# Patient Record
Sex: Female | Born: 1960
Health system: Southern US, Community
[De-identification: ages and names within clinical notes are randomized; demographics above are authoritative.]

## PROBLEM LIST (undated history)

## (undated) DIAGNOSIS — F419 Anxiety disorder, unspecified: Secondary | ICD-10-CM

## (undated) DIAGNOSIS — E785 Hyperlipidemia, unspecified: Secondary | ICD-10-CM

## (undated) DIAGNOSIS — M8580 Other specified disorders of bone density and structure, unspecified site: Secondary | ICD-10-CM

## (undated) DIAGNOSIS — A64 Unspecified sexually transmitted disease: Secondary | ICD-10-CM

## (undated) DIAGNOSIS — R32 Unspecified urinary incontinence: Secondary | ICD-10-CM

## (undated) HISTORY — DX: Hyperlipidemia, unspecified: E78.5

## (undated) HISTORY — DX: Anxiety disorder, unspecified: F41.9

## (undated) HISTORY — PX: BREAST SURGERY: SHX581

## (undated) HISTORY — DX: Unspecified urinary incontinence: R32

## (undated) HISTORY — PX: BREAST EXCISIONAL BIOPSY: SUR124

## (undated) HISTORY — DX: Unspecified sexually transmitted disease: A64

## (undated) HISTORY — DX: Other specified disorders of bone density and structure, unspecified site: M85.80

## (undated) HISTORY — PX: COLONOSCOPY: SHX174

## (undated) HISTORY — PX: OTHER SURGICAL HISTORY: SHX169

---

## 2000-08-21 ENCOUNTER — Encounter: Payer: Self-pay | Admitting: Family Medicine

## 2000-08-21 ENCOUNTER — Encounter: Admission: RE | Admit: 2000-08-21 | Discharge: 2000-08-21 | Payer: Self-pay | Admitting: Family Medicine

## 2000-10-15 ENCOUNTER — Encounter: Payer: Self-pay | Admitting: Family Medicine

## 2000-10-15 ENCOUNTER — Encounter: Admission: RE | Admit: 2000-10-15 | Discharge: 2000-10-15 | Payer: Self-pay | Admitting: Family Medicine

## 2001-10-20 ENCOUNTER — Encounter: Payer: Self-pay | Admitting: Family Medicine

## 2001-10-20 ENCOUNTER — Encounter: Admission: RE | Admit: 2001-10-20 | Discharge: 2001-10-20 | Payer: Self-pay | Admitting: Family Medicine

## 2003-12-09 ENCOUNTER — Ambulatory Visit (HOSPITAL_COMMUNITY): Admission: RE | Admit: 2003-12-09 | Discharge: 2003-12-09 | Payer: Self-pay | Admitting: Family Medicine

## 2005-02-13 ENCOUNTER — Ambulatory Visit (HOSPITAL_COMMUNITY): Admission: RE | Admit: 2005-02-13 | Discharge: 2005-02-13 | Payer: Self-pay | Admitting: Family Medicine

## 2006-04-16 ENCOUNTER — Encounter: Admission: RE | Admit: 2006-04-16 | Discharge: 2006-04-16 | Payer: Self-pay | Admitting: Family Medicine

## 2007-08-12 ENCOUNTER — Ambulatory Visit (HOSPITAL_COMMUNITY): Admission: RE | Admit: 2007-08-12 | Discharge: 2007-08-12 | Payer: Self-pay | Admitting: Family

## 2007-10-24 ENCOUNTER — Ambulatory Visit (HOSPITAL_COMMUNITY): Admission: RE | Admit: 2007-10-24 | Discharge: 2007-10-24 | Payer: Self-pay | Admitting: Family

## 2008-12-11 ENCOUNTER — Emergency Department (HOSPITAL_COMMUNITY): Admission: EM | Admit: 2008-12-11 | Discharge: 2008-12-11 | Payer: Self-pay | Admitting: Emergency Medicine

## 2009-05-13 ENCOUNTER — Ambulatory Visit (HOSPITAL_COMMUNITY): Admission: RE | Admit: 2009-05-13 | Discharge: 2009-05-13 | Payer: Self-pay | Admitting: Orthopedic Surgery

## 2009-05-31 ENCOUNTER — Emergency Department (HOSPITAL_COMMUNITY): Admission: EM | Admit: 2009-05-31 | Discharge: 2009-05-31 | Payer: Self-pay | Admitting: Family Medicine

## 2009-08-24 ENCOUNTER — Encounter: Payer: Self-pay | Admitting: Sports Medicine

## 2009-08-24 ENCOUNTER — Ambulatory Visit: Payer: Self-pay | Admitting: Sports Medicine

## 2009-08-24 DIAGNOSIS — M629 Disorder of muscle, unspecified: Secondary | ICD-10-CM

## 2009-08-29 ENCOUNTER — Ambulatory Visit (HOSPITAL_COMMUNITY): Admission: RE | Admit: 2009-08-29 | Discharge: 2009-08-29 | Payer: Self-pay | Admitting: Obstetrics and Gynecology

## 2009-10-19 ENCOUNTER — Ambulatory Visit: Payer: Self-pay | Admitting: Sports Medicine

## 2009-10-19 DIAGNOSIS — M2170 Unequal limb length (acquired), unspecified site: Secondary | ICD-10-CM | POA: Insufficient documentation

## 2009-10-19 DIAGNOSIS — M25559 Pain in unspecified hip: Secondary | ICD-10-CM

## 2010-08-17 ENCOUNTER — Ambulatory Visit: Payer: Self-pay | Admitting: Sports Medicine

## 2010-08-17 ENCOUNTER — Encounter: Admission: RE | Admit: 2010-08-17 | Discharge: 2010-08-17 | Payer: Self-pay | Admitting: Sports Medicine

## 2010-08-17 DIAGNOSIS — M542 Cervicalgia: Secondary | ICD-10-CM

## 2010-08-17 DIAGNOSIS — M5030 Other cervical disc degeneration, unspecified cervical region: Secondary | ICD-10-CM

## 2010-08-22 ENCOUNTER — Encounter: Payer: Self-pay | Admitting: Internal Medicine

## 2010-10-06 HISTORY — PX: NOVASURE ABLATION: SHX5394

## 2010-11-25 ENCOUNTER — Encounter: Payer: Self-pay | Admitting: Family Medicine

## 2010-11-27 NOTE — Procedures (Signed)
°

## 2010-12-05 NOTE — Assessment & Plan Note (Signed)
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 Penicillin 2)  ! * Anaprox  Family History: 1 son at age 55 - moved from house this year In leadership program  sister found to have cancer of colon area - this diagnosis 1 mo ago  mother with RA, pericarditis MGM w breast cancer died age 48  father with heart disease and TIAs at age 74  Social History: No tobacco use  works Chief Financial Officer at [employer] lost dog in august happily married  Review of Systems       c/o pain in RT index pain left 2nd toe  Physical Exam  General:  Well-developed,well-nourished,in no acute distress; alert,appropriate and cooperative throughout examination Neck:  lack of normal lordosis extensino cause pain in 15 deg left lat rotation is 15 to 20 deg less than RT RT lat bend causes pain on left trap Lt lat bend is OK flexion is OK Msk:  no winging of scapula normal shoulder fxn and movement  C5 - T1 strength is normal  normal sensory exam  spasm over left trapezius Additional Exam:  Xrays show DDD at C5/C6 and mild DJD at this level primarily foramina are pretty good but somewhat more narrow at this level   Impression & Recommendations:  Problem # 1:  NECK PAIN (ICD-723.1)  Her updated medication list for this problem includes:    Tramadol Hcl 50 Mg Tabs (Tramadol hcl) .Marland Kitchen... 1 by mouth qid  prn  Orders: Diagnostic X-Ray/Fluoroscopy (Diagnostic X-Ray/Flu)  Problem # 2:  DEGENERATIVE DISC DISEASE, CERVICAL SPINE (ICD-722.4) see instructions will start on  collar when we get her size  use amitriptyline at night  needs to reduce stress and work hours  Complete Medication List: 1)  Tramadol Hcl 50 Mg Tabs (Tramadol hcl) .Marland Kitchen.. 1 by mouth qid prn 2)  Amitriptyline Hcl 10 Mg Tabs (Amitriptyline hcl) .Marland Kitchen.. 1 by mouth at bedtime x 1 wk then 2 by mouth qhs  Patient Instructions: 1)  easy neck motion 2)  no bad posture 3)  use cervical collar at night for at least an hour to rest neck muscles 4)  get an xray of neck 5)  any exercise that does not seem to hurt the neck is OK x no weight lifting over head or with arms 6)  can do curls while seated 7)  I need to reck in 1 mo or sooner if you get any radiation into arm Prescriptions: TRAMADOL HCL 50 MG TABS (TRAMADOL HCL) 1 by mouth qid prn  #100 x 3   Entered and Authorized by:   Enid Baas MD   Signed by:   Enid Baas MD on 08/17/2010   Method used:   Electronically to  [facility]* (retail)       [address]       [address] Shipping/mailing       [place], [place]  [address]       Ph: [phone]       Fax: [phone]   RxID:   [id number] AMITRIPTYLINE HCL 10 MG TABS (AMITRIPTYLINE HCL) 1 by mouth at bedtime x 1 wk then 2 by mouth qhs  #60 x 3   Entered and Authorized by:   Enid Baas MD   Signed by:   Enid Baas MD on 08/17/2010   Method used:   Electronically to        [facility]* (retail)       [address]       [address] Shipping/mailing       [place], [place]  [address]       Ph: [phone]       Fax: [phone]   RxID:   [id number]   Appended Document: NECK PAIN/MJD

## 2010-12-29 ENCOUNTER — Other Ambulatory Visit: Payer: Self-pay | Admitting: Obstetrics and Gynecology

## 2010-12-29 DIAGNOSIS — Z1231 Encounter for screening mammogram for malignant neoplasm of breast: Secondary | ICD-10-CM

## 2011-01-01 ENCOUNTER — Encounter (INDEPENDENT_AMBULATORY_CARE_PROVIDER_SITE_OTHER): Payer: Self-pay | Admitting: *Deleted

## 2011-01-02 ENCOUNTER — Encounter (INDEPENDENT_AMBULATORY_CARE_PROVIDER_SITE_OTHER): Payer: Self-pay | Admitting: *Deleted

## 2011-01-03 ENCOUNTER — Encounter: Payer: Self-pay | Admitting: Internal Medicine

## 2011-01-03 ENCOUNTER — Ambulatory Visit (HOSPITAL_COMMUNITY): Payer: 59

## 2011-01-05 ENCOUNTER — Ambulatory Visit (HOSPITAL_COMMUNITY)
Admission: RE | Admit: 2011-01-05 | Discharge: 2011-01-05 | Disposition: A | Discharge status: Home / Self Care | Payer: 59 | Source: Ambulatory Visit | Attending: Obstetrics and Gynecology | Admitting: Obstetrics and Gynecology

## 2011-01-05 DIAGNOSIS — Z1231 Encounter for screening mammogram for malignant neoplasm of breast: Secondary | ICD-10-CM | POA: Insufficient documentation

## 2011-01-11 NOTE — Letter (Signed)
Summary: [facility] Instructions  [facility]  [address] [place], [place] [address]   Phone: [phone]  Fax: [phone]       Amanda Evans    02/06/1961    MRN: [id number]        Procedure Day Dorna Bloom:  Farrell Ours 01/12/11     Arrival Time: 10:30am     Procedure Time: 11:30am     Location of Procedure:                    _ X_  [facility] (4th Floor)                        PREPARATION FOR COLONOSCOPY WITH MOVIPREP   Starting 5 days prior to your procedure 01/07/11 do not eat nuts, seeds, popcorn, corn, beans, peas,  salads, or any raw vegetables.  Do not take any fiber supplements (e.g. Metamucil, Citrucel, and Benefiber).  THE DAY BEFORE YOUR PROCEDURE         DATE:  THURSDAY 01/11/11  1.  Drink clear liquids the entire day-NO SOLID FOOD  2.  Do not drink anything colored red or purple.  Avoid juices with pulp.  No orange juice.  3.  Drink at least 64 oz. (8 glasses) of fluid/clear liquids during the day to prevent dehydration and help the prep work efficiently.  CLEAR LIQUIDS INCLUDE: Water Jello Ice Popsicles Tea (sugar ok, no milk/cream) Powdered fruit flavored drinks Coffee (sugar ok, no milk/cream) Gatorade Juice: apple, white grape, white cranberry  Lemonade Clear bullion, consomm, broth Carbonated beverages (any kind) Strained chicken noodle soup Hard Candy                             4.  In the morning, mix first dose of MoviPrep solution:    Empty 1 Pouch A and 1 Pouch B into the disposable container    Add lukewarm drinking water to the top line of the container. Mix to dissolve    Refrigerate (mixed solution should be used within 24 hrs)  5.  Begin drinking the prep at 5:00 p.m. The MoviPrep container is divided by 4 marks.   Every 15 minutes drink the solution down to the next mark (approximately 8 oz) until the full liter is complete.   6.  Follow completed prep with 16 oz of clear liquid of your choice (Nothing red  or purple).  Continue to drink clear liquids until bedtime.  7.  Before going to bed, mix second dose of MoviPrep solution:    Empty 1 Pouch A and 1 Pouch B into the disposable container    Add lukewarm drinking water to the top line of the container. Mix to dissolve    Refrigerate  THE DAY OF YOUR PROCEDURE      DATE: FRIDAY 01/12/11  Beginning at  6:30a.m. (5 hours before procedure):         1. Every 15 minutes, drink the solution down to the next mark (approx 8 oz) until the full liter is complete.  2. Follow completed prep with 16 oz. of clear liquid of your choice.    3. You may drink clear liquids until  9:30am  (2 HOURS BEFORE PROCEDURE).   MEDICATION INSTRUCTIONS  Unless otherwise instructed, you should take regular prescription medications with a small sip of water   as early as possible the morning of your procedure.  OTHER INSTRUCTIONS  You will need a responsible adult at least 48 years of age to accompany you and drive you home.   This person must remain in the waiting room during your procedure.  Wear loose fitting clothing that is easily removed.  Leave jewelry and other valuables at home.  However, you may wish to bring a book to read or  an iPod/MP3 player to listen to music as you wait for your procedure to start.  Remove all body piercing jewelry and leave at home.  Total time from sign-in until discharge is approximately 2-3 hours.  You should go home directly after your procedure and rest.  You can resume normal activities the  day after your procedure.  The day of your procedure you should not:   Drive   Make legal decisions   Operate machinery   Drink alcohol   Return to work  You will receive specific instructions about eating, activities and medications before you leave.    The above instructions have been reviewed and explained to me by   Wyona Almas RN  January 03, 2011 2:35 PM     I fully understand and can  verbalize these instructions _____________________________ Date _________

## 2011-01-11 NOTE — Letter (Signed)
Summary: Pre Visit Letter Revised  [facility]  [address] [place], [place] [address]   Phone: [phone]  Fax: [phone]        01/01/2011 MRN: [id number] Amanda Evans [address] [place], [place]  [address]             Procedure Date:  01-12-11           Direct Colon--Dr. Juanda Chance  Welcome to the Gastroenterology Division at [facility].    You are scheduled to see a nurse for your pre-procedure visit on 01-03-11 at 8:30a.m. on the 3rd floor at [facility], [address]  We ask that you try to arrive at our office 15 minutes prior to your appointment time to allow for check-in.  Please take a minute to review the attached form.  If you answer "Yes" to one or more of the questions on the first page, we ask that you call the person listed at your earliest opportunity.  If you answer "No" to all of the questions, please complete the rest of the form and bring it to your appointment.    Your nurse visit will consist of discussing your medical and surgical history, your immediate family medical history, and your medications.   If you are unable to list all of your medications on the form, please bring the medication bottles to your appointment and we will list them.  We will need to be aware of both prescribed and over the counter drugs.  We will need to know exact dosage information as well.    Please be prepared to read and sign documents such as consent forms, a financial agreement, and acknowledgement forms.  If necessary, and with your consent, a friend or relative is welcome to sit-in on the nurse visit with you.  Please bring your insurance card so that we may make a copy of it.  If your insurance requires a referral to see a specialist, please bring your referral form from your primary care physician.  No co-pay is required for this nurse visit.     If you cannot keep your appointment, please call [phone] to cancel or reschedule  prior to your appointment date.  This allows [place] the opportunity to schedule an appointment for another patient in need of care.    Thank you for choosing  Gastroenterology for your medical needs.  We appreciate the opportunity to care for you.  Please visit [place] at our website  to learn more about our practice.  Sincerely, The Gastroenterology Division

## 2011-01-11 NOTE — Miscellaneous (Signed)
Summary: [facility] Pervisit/prep  Clinical Lists Changes  Medications: Added new medication of MOVIPREP 100 GM  SOLR (PEG-KCL-NACL-NASULF-NA ASC-C) As per prep instructions. - Signed Rx of MOVIPREP 100 GM  SOLR (PEG-KCL-NACL-NASULF-NA ASC-C) As per prep instructions.;  #1 x 0;  Signed;  Entered by: Wyona Almas RN;  Authorized by: Hart Carwin MD;  Method used: Electronically to [facility]*, [address] Shipping/mailing, [place], [place]  [address], Ph: [phone], Fax: [phone] Allergies: Changed allergy or adverse reaction from PENICILLIN to PENICILLIN Changed allergy or adverse reaction from * ANAPROX to [facility]    Prescriptions: MOVIPREP 100 GM  SOLR (PEG-KCL-NACL-NASULF-NA ASC-C) As per prep instructions.  #1 x 0   Entered by:   Wyona Almas RN   Authorized by:   Hart Carwin MD   Signed by:   Wyona Almas RN on 01/03/2011   Method used:   Electronically to        [facility]* (retail)       [address]       [address] Shipping/mailing       [place], [place]  [address]       Ph: [phone]       Fax: [phone]   RxID:   [id number]

## 2011-01-11 NOTE — Letter (Signed)
Summary: [facility]  [facility]   Imported By: Sherian Rein 01/05/2011 06:37:01  _____________________________________________________________________  External Attachment:    Type:   Image     Comment:   External Document

## 2011-01-12 ENCOUNTER — Encounter: Payer: Self-pay | Admitting: Internal Medicine

## 2011-01-12 ENCOUNTER — Other Ambulatory Visit (AMBULATORY_SURGERY_CENTER): Payer: 59 | Admitting: Internal Medicine

## 2011-01-12 DIAGNOSIS — Z800 Family history of malignant neoplasm of digestive organs: Secondary | ICD-10-CM

## 2011-01-12 DIAGNOSIS — Z1211 Encounter for screening for malignant neoplasm of colon: Secondary | ICD-10-CM

## 2011-01-16 NOTE — Procedures (Signed)
Summary: Colonoscopy  Patient: Amanda Evans Note: All result statuses are Final unless otherwise noted.  Tests: (1) Colonoscopy (COL)   COL Colonoscopy           DONE     [facility]     [address]     [place], [place]  [address]          COLONOSCOPY PROCEDURE REPORT          PATIENT:  Amanda, Evans  MR#:  [id number]     BIRTHDATE:  Jun 24, 1961, 50 yrs. old  GENDER:  female     ENDOSCOPIST:  Hedwig Morton. Juanda Chance, MD     REF. BY:  Marjory Lies, M.D.     PROCEDURE DATE:  01/12/2011     PROCEDURE:  Colonoscopy [address]     ASA CLASS:  Class I     INDICATIONS:  family history of colon cancer sister in [place] with     Stage 4 colon cancer at age 71     MEDICATIONS:   Versed 10 mg, Fentanyl 100 mcg          DESCRIPTION OF PROCEDURE:   After the risks benefits and     alternatives of the procedure were thoroughly explained, informed     consent was obtained.  Digital rectal exam was performed and     revealed no rectal masses.   The LB [id number]-[id number] endoscope     was introduced through the anus and advanced to the cecum, which     was identified by both the appendix and ileocecal valve, without     limitations.  The quality of the prep was excellent, using     MiraLax.  The instrument was then slowly withdrawn as the colon     was fully examined.     <<PROCEDUREIMAGES>>          FINDINGS:  No polyps or cancers were seen (see image1, image2, and     image3).   Retroflexed views in the rectum revealed no     abnormalities.    The scope was then withdrawn from the patient     and the procedure completed.          COMPLICATIONS:  None     ENDOSCOPIC IMPRESSION:     1) No polyps or cancers     2) Normal colonoscopy     RECOMMENDATIONS:     1) high fiber diet     REPEAT EXAM:  In 5 year(s) for.          ______________________________     Hedwig Morton. Juanda Chance, MD          CC:          n.     eSIGNED:   Hedwig Morton. Sayan Aldava at 01/12/2011 12:26 PM          Allen Derry,  [id number]  Note: An exclamation mark (!) indicates a result that was not dispersed into the flowsheet. Document Creation Date: 01/12/2011 12:26 PM _______________________________________________________________________  (1) Order result status: Final Collection or observation date-time: 01/12/2011 12:18 Requested date-time:  Receipt date-time:  Reported date-time:  Referring Physician:   Ordering Physician: Lina Sar [phone]) Specimen Source:  Source: Launa Grill Order Number: [id number] Lab site:   Appended Document: Colonoscopy    Clinical Lists Changes  Observations: Added new observation of COLONNXTDUE: 01/2016 (01/12/2011 12:41)

## 2011-02-20 LAB — POCT URINALYSIS DIP (DEVICE)
Glucose, UA: 250 mg/dL — AB
Nitrite: POSITIVE — AB
Specific Gravity, Urine: 1.01 (ref 1.005–1.030)
Urobilinogen, UA: 2 mg/dL — ABNORMAL HIGH (ref 0.0–1.0)

## 2011-02-20 LAB — URINE CULTURE

## 2011-08-23 ENCOUNTER — Other Ambulatory Visit: Payer: Self-pay | Admitting: Obstetrics and Gynecology

## 2011-08-23 DIAGNOSIS — Z1231 Encounter for screening mammogram for malignant neoplasm of breast: Secondary | ICD-10-CM

## 2012-04-21 ENCOUNTER — Encounter: Payer: Self-pay | Admitting: Family Medicine

## 2012-04-21 ENCOUNTER — Ambulatory Visit (INDEPENDENT_AMBULATORY_CARE_PROVIDER_SITE_OTHER): Payer: 59 | Admitting: Family Medicine

## 2012-04-21 VITALS — BP 117/78 | HR 91 | Temp 97.9°F | Ht 64.0 in | Wt 115.0 lb

## 2012-04-21 DIAGNOSIS — M25552 Pain in left hip: Secondary | ICD-10-CM

## 2012-04-21 DIAGNOSIS — M7662 Achilles tendinitis, left leg: Secondary | ICD-10-CM

## 2012-04-21 DIAGNOSIS — M7660 Achilles tendinitis, unspecified leg: Secondary | ICD-10-CM

## 2012-04-21 DIAGNOSIS — M25559 Pain in unspecified hip: Secondary | ICD-10-CM

## 2012-04-21 NOTE — Patient Instructions (Addendum)
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 Lowering/raise on a step exercises 3 x 15 once or twice a day - two feet first then one (start with 3 x 6) in 7-10 days. Can add heel walks, toe walks forward and backward as well Ice bucket 10-15 minutes at end of day - can ice 3-4 times a day. Avoid uneven ground, hills. Heel lifts will help unload the area. Consider orthotics with heel lift Physical therapy and nitro patches are considerations if you don't improve as expected.  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 This is best treated with physical therapy, stretches, and exercises - do home program on days you do not go there. Aleve as noted above. Icing as needed. Follow up with me in 6 weeks for both issues.

## 2012-04-21 NOTE — Progress Notes (Signed)
Subjective:    Patient ID: Amanda Evans, female    DOB: July 28, 1961, 51 y.o.   MRN: [id number]  PCP: Dr. Genelle Bal - [facility]  HPI 51 yo F here for left hip and achilles pain.  1. 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 Overall improved with home exercises up until 3-4 months ago in a jazzercise class. Felt a sharp snap anterolateral left hip with difficulty walking following this. Has improved but still has pain especially with turning, getting in and out of chairs/cars. Feels a pop or snap with these types of activities. Worse with abduction motion. No numbness or tingling. No back pain. No radiation into leg.  2. Left heel pain Pain for past 4 days has worsened posterior heel within achilles. No swelling or bruising. No obvious injury. Had been wearing heels before this. Worse with pushing off ball of foot. No prior issues with heel. Has tried wrapping only.  History reviewed. No pertinent past medical history.  Current Outpatient Prescriptions on File Prior to Visit  Medication Sig Dispense Refill  . traMADol (ULTRAM) 50 MG tablet Take 50 mg by mouth 4 (four) times daily as needed.          Past Surgical History  Procedure Date  . Cesarean section   . Laser ablation condyloma cervical / vulvar     Allergies  Allergen Reactions  . Naproxen Sodium     REACTION: hives  . Penicillins     REACTION: swelling    History   Social History  . Marital Status: Married    Spouse Name: N/A    Number of Children: N/A  . Years of Education: N/A   Occupational History  . Not on file.   Social History Main Topics  . Smoking status: Never Smoker   . Smokeless tobacco: Not on file  . Alcohol Use: Not on file  . Drug Use: Not on file  . Sexually Active: Not on file   Other Topics Concern  . Not on file   Social History Narrative  . No narrative on file    Family History  Problem Relation Age of Onset  . Hyperlipidemia  Mother   . Hyperlipidemia Father   . Heart attack Father   . Sudden death Neg Hx   . Hypertension Neg Hx   . Diabetes Neg Hx     BP 117/78  Pulse 91  Temp 97.9 F (36.6 C) (Oral)  Ht 5\' 4"  (1.626 m)  Wt 115 lb (52.164 kg)  BMI 19.74 kg/m2  Review of Systems See HPI above.    Objective:   Physical Exam Gen: NAD  Back/L hip: No gross deformity, scoliosis. TTP lateral quad, anterior portion of tensor fascia lata.  No back paraspinal or midline or bony TTP. FROM without pain in back - pain in area noted above with hip abduction and external rotation. Strength LEs 5/5 all muscle groups except 3+/5 with hip abduction - pain reproduced in area noted above with this motion.   2+ MSRs in patellar and achilles tendons, equal bilaterally. Negative SLRs. Sensation intact to light touch bilaterally. Negative logroll bilateral hips Negative fabers and piriformis stretches. Leg lengths equal  L foot/ankle: No gross deformity, swelling, ecchymoses FROM TTP 3 cm proximal to achilles insertion on calcaneus. Negative ant drawer and talar tilt.   Negative syndesmotic compression. Thompsons test negative. NV intact distally. Pain with calf raise within achilles.    Assessment & Plan:  1. Left achilles tendinopathy - heel lifts provided, start home exercises after a week - handout provided.  Icing, aleve as needed.  Avoid uneven ground and hills.  Consider orthotics, nitro, formal PT if not improving as expected.  2. Left hip pain - consistent with snapping hip syndrome.  Start formal PT for stretches, exercises.  Aleve, icing as needed.  F/u in 6 weeks.  Handout provided.

## 2012-04-22 DIAGNOSIS — M7662 Achilles tendinitis, left leg: Secondary | ICD-10-CM | POA: Insufficient documentation

## 2012-04-22 NOTE — Assessment & Plan Note (Signed)
consistent with snapping hip syndrome.  Start formal PT for stretches, exercises.  Aleve, icing as needed.  F/u in 6 weeks.  Handout provided.

## 2012-04-22 NOTE — Assessment & Plan Note (Signed)
heel lifts provided, start home exercises after a week - handout provided.  Icing, aleve as needed.  Avoid uneven ground and hills.  Consider orthotics, nitro, formal PT if not improving as expected.

## 2012-05-07 ENCOUNTER — Ambulatory Visit: Payer: 59 | Attending: Family Medicine

## 2012-05-07 DIAGNOSIS — M25559 Pain in unspecified hip: Secondary | ICD-10-CM | POA: Insufficient documentation

## 2012-05-07 DIAGNOSIS — IMO0001 Reserved for inherently not codable concepts without codable children: Secondary | ICD-10-CM | POA: Insufficient documentation

## 2012-05-07 DIAGNOSIS — M25659 Stiffness of unspecified hip, not elsewhere classified: Secondary | ICD-10-CM | POA: Insufficient documentation

## 2012-05-13 ENCOUNTER — Ambulatory Visit: Payer: 59

## 2012-05-15 ENCOUNTER — Ambulatory Visit: Payer: 59

## 2012-05-20 ENCOUNTER — Ambulatory Visit: Payer: 59

## 2012-05-22 ENCOUNTER — Ambulatory Visit: Payer: 59

## 2012-05-27 ENCOUNTER — Ambulatory Visit: Payer: 59

## 2012-05-28 ENCOUNTER — Ambulatory Visit: Payer: 59

## 2012-05-30 ENCOUNTER — Ambulatory Visit: Payer: 59

## 2012-06-03 ENCOUNTER — Ambulatory Visit: Payer: 59

## 2012-06-05 ENCOUNTER — Ambulatory Visit: Payer: 59 | Attending: Family Medicine

## 2012-06-05 DIAGNOSIS — M25559 Pain in unspecified hip: Secondary | ICD-10-CM | POA: Insufficient documentation

## 2012-06-05 DIAGNOSIS — IMO0001 Reserved for inherently not codable concepts without codable children: Secondary | ICD-10-CM | POA: Insufficient documentation

## 2012-06-05 DIAGNOSIS — M25669 Stiffness of unspecified knee, not elsewhere classified: Secondary | ICD-10-CM | POA: Insufficient documentation

## 2012-08-14 ENCOUNTER — Other Ambulatory Visit: Payer: Self-pay | Admitting: Obstetrics and Gynecology

## 2012-08-14 DIAGNOSIS — Z1231 Encounter for screening mammogram for malignant neoplasm of breast: Secondary | ICD-10-CM

## 2012-08-22 ENCOUNTER — Ambulatory Visit (HOSPITAL_COMMUNITY)
Admission: RE | Admit: 2012-08-22 | Discharge: 2012-08-22 | Disposition: A | Discharge status: Home / Self Care | Payer: 59 | Source: Ambulatory Visit | Attending: Obstetrics and Gynecology | Admitting: Obstetrics and Gynecology

## 2012-08-22 DIAGNOSIS — Z1231 Encounter for screening mammogram for malignant neoplasm of breast: Secondary | ICD-10-CM | POA: Insufficient documentation

## 2012-08-27 ENCOUNTER — Other Ambulatory Visit: Payer: Self-pay | Admitting: Obstetrics and Gynecology

## 2012-08-27 DIAGNOSIS — R928 Other abnormal and inconclusive findings on diagnostic imaging of breast: Secondary | ICD-10-CM

## 2012-08-29 ENCOUNTER — Ambulatory Visit
Admission: RE | Admit: 2012-08-29 | Discharge: 2012-08-29 | Disposition: A | Discharge status: Home / Self Care | Payer: 59 | Source: Ambulatory Visit | Attending: Obstetrics and Gynecology | Admitting: Obstetrics and Gynecology

## 2012-08-29 DIAGNOSIS — R928 Other abnormal and inconclusive findings on diagnostic imaging of breast: Secondary | ICD-10-CM

## 2013-08-21 ENCOUNTER — Telehealth: Payer: Self-pay | Admitting: Nurse Practitioner

## 2013-08-21 NOTE — Telephone Encounter (Signed)
Patient says she has breast cyst and would like an appointment.

## 2013-08-21 NOTE — Telephone Encounter (Signed)
Message left to return call to Sutter Ahlgren at [phone].    

## 2013-08-21 NOTE — Telephone Encounter (Signed)
Patient returning Tracy's call. °

## 2013-08-21 NOTE — Telephone Encounter (Signed)
Spoke with patient she feels that breast cysts have continued. She attempted to call and make screening appointment  was reccommended to have breast check by provider and then can order diagnostic at this time. Scheduled appointment with Lauro Franklin, FNP at patient's request.   Routing to provider for review.

## 2013-08-24 ENCOUNTER — Ambulatory Visit (INDEPENDENT_AMBULATORY_CARE_PROVIDER_SITE_OTHER): Payer: 59 | Admitting: Nurse Practitioner

## 2013-08-24 ENCOUNTER — Encounter: Payer: Self-pay | Admitting: Nurse Practitioner

## 2013-08-24 VITALS — BP 110/60 | HR 84 | Ht 63.75 in | Wt 116.0 lb

## 2013-08-24 DIAGNOSIS — N63 Unspecified lump in unspecified breast: Secondary | ICD-10-CM

## 2013-08-24 DIAGNOSIS — N6320 Unspecified lump in the left breast, unspecified quadrant: Secondary | ICD-10-CM

## 2013-08-24 NOTE — Progress Notes (Signed)
Appointment made for Mammogram/Diagnostic Mammogram. Appointment made with patient in office for 10/31 at 1315 patient agreeable to time/date at [facility] imaging.
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
## 2013-08-27 NOTE — Progress Notes (Signed)
Encounter reviewed by Dr. Brook Silva.  

## 2013-09-04 ENCOUNTER — Ambulatory Visit
Admission: RE | Admit: 2013-09-04 | Discharge: 2013-09-04 | Disposition: A | Discharge status: Home / Self Care | Payer: 59 | Source: Ambulatory Visit | Attending: Nurse Practitioner | Admitting: Nurse Practitioner

## 2013-09-04 DIAGNOSIS — N6320 Unspecified lump in the left breast, unspecified quadrant: Secondary | ICD-10-CM

## 2013-09-24 ENCOUNTER — Encounter: Payer: Self-pay | Admitting: Nurse Practitioner

## 2013-09-25 ENCOUNTER — Ambulatory Visit (INDEPENDENT_AMBULATORY_CARE_PROVIDER_SITE_OTHER): Payer: 59 | Admitting: Nurse Practitioner

## 2013-09-25 ENCOUNTER — Encounter: Payer: Self-pay | Admitting: Nurse Practitioner

## 2013-09-25 VITALS — BP 110/60 | HR 64 | Resp 16 | Ht 64.0 in | Wt 115.0 lb

## 2013-09-25 DIAGNOSIS — Z01419 Encounter for gynecological examination (general) (routine) without abnormal findings: Secondary | ICD-10-CM

## 2013-09-25 DIAGNOSIS — Z0000 Encounter for general adult medical examination without abnormal findings: Secondary | ICD-10-CM

## 2013-09-25 LAB — POCT URINALYSIS DIPSTICK
Bilirubin, UA: NEGATIVE
Ketones, UA: NEGATIVE
Protein, UA: NEGATIVE
Urobilinogen, UA: NEGATIVE
pH, UA: 6.5

## 2013-09-25 LAB — HEMOGLOBIN, FINGERSTICK: Hemoglobin, fingerstick: 14.4 g/dL (ref 12.0–16.0)

## 2013-09-25 NOTE — Patient Instructions (Signed)

## 2013-09-25 NOTE — Progress Notes (Signed)
Patient ID: Amanda Evans, female   DOB: Sep 30, 1961, 52 y.o.   MRN: [id number] 52 y.o. G7P1011 Married Caucasian Fe here for annual exam.  No vaso symptoms. No vaginal dryness.  Recent breat pain with Mammo and US showing multiple cyst bilaterally.  No LMP recorded. Patient has had an ablation.          Sexually active: yes  The current method of family planning is vasectomy.    Exercising: no  The patient does not participate in regular exercise at present. Smoker:  no  Health Maintenance: Pap: 09/23/12, ASCUS, neg HR HPV MMG: 09/07/13, Bi-Rads 2: Benign findings Colonoscopy: 09/2010, repeat in 5 years BMD: never TDaP: 02/13/05 ?Td  only Labs: HB: 14.4 Urine: trace RBC, 1+ WBC, pH 6.5   reports that she has never smoked. She has never used smokeless tobacco. She reports that she does not drink alcohol or use illicit drugs.  Past Medical History  Diagnosis Date  . STD (sexually transmitted disease)     HSV    Past Surgical History  Procedure Laterality Date  . Cesarean section    . Novasure ablation  12.2.11    in office, v-vagal response    Current Outpatient Prescriptions  Medication Sig Dispense Refill  . Ascorbic Acid (VITAMIN C PO) Take by mouth.      . Calcium-Vitamin A-Vitamin D (LIQUID CALCIUM PO) Take 1,000 mg by mouth daily.      . Cholecalciferol (VITAMIN D) 2000 UNITS CAPS Take 1 capsule by mouth daily.      . Ginkgo Biloba 100 MG CAPS Take 1 capsule by mouth daily.      . NON FORMULARY Origin of Life liquid multivitamin by [employer]      . Omega-3 Fatty Acids (FISH OIL) 1000 MG CAPS Take 1 capsule by mouth daily.       No current facility-administered medications for this visit.    Family History  Problem Relation Age of Onset  . Hyperlipidemia Mother   . Thyroid disease Mother   . Osteoporosis Mother   . Hyperlipidemia Father   . Heart attack Father   . Heart disease Father   . Transient ischemic attack Father   . Sudden death Neg Hx   .  Hypertension Neg Hx   . Diabetes Neg Hx   . Colon cancer Sister 58  . Breast cancer Maternal Grandmother   . Heart disease Paternal Grandmother   . Heart disease Paternal Grandfather   . Breast cancer Sister 57    mastectomy    ROS:  Pertinent items are noted in HPI.  Otherwise, a comprehensive ROS was negative.  Exam:   BP 110/60  Pulse 64  Resp 16  Ht 5\' 4"  (1.626 m)  Wt 115 lb (52.164 kg)  BMI 19.73 kg/m2 Height: 5\' 4"  (162.6 cm)  Ht Readings from Last 3 Encounters:  09/25/13 5\' 4"  (1.626 m)  08/24/13 5' 3.75" (1.619 m)  04/21/12 5\' 4"  (1.626 m)    General appearance: alert, cooperative and appears stated age Head: Normocephalic, without obvious abnormality, atraumatic Neck: no adenopathy, supple, symmetrical, trachea midline and thyroid normal to inspection and palpation Lungs: clear to auscultation bilaterally Breasts: normal appearance, no masses or tenderness, FCB changes bilaterally Heart: regular rate and rhythm Abdomen: soft, non-tender; no masses,  no organomegaly Extremities: extremities normal, atraumatic, no cyanosis or edema Skin: Skin color, texture, turgor normal. No rashes or lesions Lymph nodes: Cervical, supraclavicular, and axillary nodes normal. No abnormal inguinal  nodes palpated Neurologic: Grossly normal   Pelvic: External genitalia:  no lesions              Urethra:  normal appearing urethra with no masses, tenderness or lesions              Bartholin's and Skene's: normal                 Vagina: normal appearing vagina with normal color and discharge, no lesions              Cervix: anteverted              Pap taken: yes Bimanual Exam:  Uterus:  normal size, contour, position, consistency, mobility, non-tender              Adnexa: no mass, fullness, tenderness               Rectovaginal: Confirms               Anus:  normal sphincter tone, no lesions  A:  Well Woman with normal exam  S/P Novasure  Ablation 10/06/10  Husband with  vasectomy  History of FCB changes with recent Mammo and US showing multiple cyst  History of HSV  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  Needs to work on upper body exercise. return annually or prn  An After Visit Summary was printed and given to the patient.

## 2013-09-28 NOTE — Progress Notes (Signed)
Encounter reviewed by Dr. Ziggy Chanthavong Silva.  

## 2014-01-06 ENCOUNTER — Encounter: Payer: Self-pay | Admitting: Nurse Practitioner

## 2014-01-14 ENCOUNTER — Other Ambulatory Visit: Payer: Self-pay | Admitting: Nurse Practitioner

## 2014-01-14 DIAGNOSIS — Z0000 Encounter for general adult medical examination without abnormal findings: Secondary | ICD-10-CM

## 2014-01-21 ENCOUNTER — Telehealth: Payer: Self-pay | Admitting: Nurse Practitioner

## 2014-01-21 ENCOUNTER — Other Ambulatory Visit: Payer: Self-pay

## 2014-01-21 NOTE — Telephone Encounter (Signed)
Pt cancel and rescheduled fasting labs because she forgot and ate this morning.Appt 01/22/14@8 :40.

## 2014-01-22 ENCOUNTER — Other Ambulatory Visit (INDEPENDENT_AMBULATORY_CARE_PROVIDER_SITE_OTHER): Payer: 59

## 2014-01-22 DIAGNOSIS — Z0000 Encounter for general adult medical examination without abnormal findings: Secondary | ICD-10-CM

## 2014-01-22 LAB — COMPREHENSIVE METABOLIC PANEL
ALBUMIN: 4.3 g/dL (ref 3.5–5.2)
ALT: 15 U/L (ref 0–35)
AST: 16 U/L (ref 0–37)
Alkaline Phosphatase: 35 U/L — ABNORMAL LOW (ref 39–117)
BUN: 12 mg/dL (ref 6–23)
CALCIUM: 9.2 mg/dL (ref 8.4–10.5)
CO2: 27 mEq/L (ref 19–32)
Chloride: 100 mEq/L (ref 96–112)
Creat: 0.8 mg/dL (ref 0.50–1.10)
Glucose, Bld: 79 mg/dL (ref 70–99)
Potassium: 4.5 mEq/L (ref 3.5–5.3)
SODIUM: 135 meq/L (ref 135–145)
TOTAL PROTEIN: 6.7 g/dL (ref 6.0–8.3)
Total Bilirubin: 0.7 mg/dL (ref 0.2–1.2)

## 2014-01-22 LAB — LIPID PANEL
Cholesterol: 202 mg/dL — ABNORMAL HIGH (ref 0–200)
HDL: 66 mg/dL (ref >39)
LDL Cholesterol: 122 mg/dL — ABNORMAL HIGH (ref 0–99)
TRIGLYCERIDES: 69 mg/dL (ref <150)
Total CHOL/HDL Ratio: 3.1 Ratio
VLDL: 14 mg/dL (ref 0–40)

## 2014-01-23 LAB — TSH: TSH: 3.782 u[IU]/mL (ref 0.350–4.500)

## 2014-01-23 LAB — VITAMIN D 25 HYDROXY (VIT D DEFICIENCY, FRACTURES): Vit D, 25-Hydroxy: 85 ng/mL (ref 30–89)

## 2014-02-16 ENCOUNTER — Encounter: Payer: Self-pay | Admitting: Nurse Practitioner

## 2014-03-12 ENCOUNTER — Telehealth: Payer: Self-pay | Admitting: Nurse Practitioner

## 2014-03-12 NOTE — Telephone Encounter (Signed)
Spoke to patient and advised that she can bring form here for Patty to review and she can complete what she can of the form. Patient states form is very basic.  Patient states if she needs a visit she is willing to do that as well. Patient states she will drop off form and have Patty review and if can fill out or needs a visit we will let her know.

## 2014-03-12 NOTE — Telephone Encounter (Signed)
Patient wants to know if we can fill out a paper for her saying that she is okay health wise for her to go on a missions trip in July. Wasn't sure if she needed an appointment or if we would just fill it out.

## 2014-03-14 NOTE — Telephone Encounter (Signed)
Ok I will review and see how extensive or if another OV is needed

## 2014-03-15 ENCOUNTER — Other Ambulatory Visit: Payer: Self-pay | Admitting: Nurse Practitioner

## 2014-03-15 MED ORDER — CIPROFLOXACIN HCL 500 MG PO TABS: 500.0000 mg | ORAL_TABLET | Freq: Two times a day (BID) | ORAL | Status: DC

## 2014-03-15 NOTE — Telephone Encounter (Signed)
Routing to Patricia Rolen-Grubb, FNP for review. 

## 2014-03-15 NOTE — Telephone Encounter (Signed)
Patient is asking for status of the form she dropped off to be completed for a mission trip to [place]. Patient is asking for a prescription for malaria and cipro.

## 2014-03-15 NOTE — Telephone Encounter (Signed)
Form is completed and we do not give RX for Malaria.  When I went to [place] had to give me that treatment and I think they still do.  As far as Cipro for travelers diarrhea and URI/ UTI I think that is reasonable.  She should also take Pepto bismol tabs with her.  Make sure TDaP is UTD.  I will place order for Cipro.  Directions are as follows:  Travelers diarrhea Cipro 500 mg BID for 3 days.  For UTI again 3 days.  For URI Cipro for a week.

## 2014-03-16 NOTE — Telephone Encounter (Signed)
Form completed and at front desk in file folder.  Patient given message from Amanda Evans, [place] and verbalized understanding. She will follow up with health department.   Will close encounter.

## 2014-05-26 ENCOUNTER — Telehealth: Payer: Self-pay | Admitting: Nurse Practitioner

## 2014-05-26 NOTE — Telephone Encounter (Signed)
Spoke with patient. Patient states that she has "fibrocystic breast and dense breast." States that since last MMG she has noticed changes in her right breast. Patient states that she has a lump in her right breast that is the size of a golf ball. Noticed the lump two weeks ago. Denies pain in breast. Patient declines appointment with another provider this week stating that she would like to wait for Patty to return. Requesting afternoon appointment on Tuesday due to work schedule. Appointment scheduled for Tuesday 7/28 at 3:30pm. Agreeable to date and time. Patient would like to discuss skin tag removal at this appointment. Advised patient that she would have to discuss this with Milford Cage, FNP at office visit. Patient agreeable.  Routing to provider for final review. Patient agreeable to disposition. Will close encounter

## 2014-05-26 NOTE — Telephone Encounter (Signed)
Patient has noticed some changes in her breast since her last MMG. Patient also has some questions regarding skin tag removal.

## 2014-06-01 ENCOUNTER — Ambulatory Visit (INDEPENDENT_AMBULATORY_CARE_PROVIDER_SITE_OTHER): Payer: 59 | Admitting: Nurse Practitioner

## 2014-06-01 ENCOUNTER — Encounter: Payer: Self-pay | Admitting: Obstetrics & Gynecology

## 2014-06-01 ENCOUNTER — Encounter: Payer: Self-pay | Admitting: Nurse Practitioner

## 2014-06-01 VITALS — BP 120/72 | HR 68 | Ht 64.0 in | Wt 118.0 lb

## 2014-06-01 DIAGNOSIS — N6001 Solitary cyst of right breast: Secondary | ICD-10-CM

## 2014-06-01 DIAGNOSIS — N6009 Solitary cyst of unspecified breast: Secondary | ICD-10-CM

## 2014-06-01 MED ORDER — NITROFURANTOIN MONOHYD MACRO 100 MG PO CAPS: ORAL_CAPSULE | ORAL | Status: DC

## 2014-06-01 NOTE — Patient Instructions (Signed)
Recheck in 3 weeks

## 2014-06-01 NOTE — Progress Notes (Signed)
   Subjective:   53 y.o. Married Caucasian female presents for evaluation of right breast mass. Onset of the symptoms was July 10 th. Patient sought evaluation because of breast lump and right breast outer quadrant.  Contributing factors include sister with breast CA and family hx of colon CA. Denies other symptoms.  Patient denies history of trauma, bites, or injuries. Last mammogram was 09/04/13.  Previous evaluation has included left  Breast US 09/04/13.   Took antibiotic for malaria for 2 days prior to travel on 7/8/and a week of travel but had to stop after 7 days secondary to nausea and diarrhea and stopped on 7/11.  Then took Cipro, but infrequent doses secondary to nausea and stopped after 3 days. She also ask for refill of Macrobid to use PC.  AEX is not due until November.   Review of Systems Pertinent items are noted in HPI.  Denies pain, nipple discharge, malaise.   Objective:   General appearance: alert, cooperative and appears stated age Head: Normocephalic, without obvious abnormality, atraumatic Neck: no adenopathy, supple, symmetrical, trachea midline and thyroid not enlarged, symmetric, no tenderness/mass/nodules Back: negative, symmetric, no curvature. ROM normal. No CVA tenderness. Breasts: positive findings: bilateral fibrocystic breast.  There is a new nodule on the right with a mass that is 3-4 cm in size at 2 inches from the areola.  Area is mobile and non tender.  No nipple discharge, no lymph nodes  Abdomen: soft, non-tender; bowel sounds normal; no masses,  no organomegaly    Assessment:   ASSESSMENT:Patient is diagnosed with breast mass, likely benign and FCB changes bilaterally    Plan:   PLAN:  Patient was seen in consult with Dr. Sabra Heck The patient has a documented plan to follow with further care of recheck in 3 weeks  2. PLAN: FOLLOW and exam repeat by Dr. Sabra Heck  If symptoms change in any way to call back.  Refill on Macrobid 100 mg PC prn #30/5  refills sent to pharmacy.

## 2014-06-02 ENCOUNTER — Telehealth: Payer: Self-pay | Admitting: Emergency Medicine

## 2014-06-02 MED ORDER — NITROFURANTOIN MONOHYD MACRO 100 MG PO CAPS: ORAL_CAPSULE | ORAL | Status: DC

## 2014-06-02 NOTE — Telephone Encounter (Signed)
Received fax from [facility] requesting clarification for rx for Macrobid 100 mg Capsule. Current order is one cap prn. Gaspar Bidding advises needs frequency for insurance coverage. Advised order can be placed 1 capsule daily as needed. Gaspar Bidding advised that many patients do not like bottle to say after sexual activity.   Routing to provider for final review. Patient agreeable to disposition. Will close encounter

## 2014-06-02 NOTE — Progress Notes (Signed)
Pt seen and examined.  Two cystic areas in right breast noted--largest about 4cm.  Lesions both mobile and smooth but do feel connected.  D/W pt aspiration, watchful waiting, cyst drainage under u/s guidance.  Pt is without pain.  Reassured about benign nature.  She elected watchful waiting.  Will re-examine in 3 months.  Reviewed personally.  Felipa Emory, MD.

## 2014-06-22 ENCOUNTER — Encounter: Payer: Self-pay | Admitting: Obstetrics & Gynecology

## 2014-06-22 ENCOUNTER — Ambulatory Visit (INDEPENDENT_AMBULATORY_CARE_PROVIDER_SITE_OTHER): Payer: 59 | Admitting: Obstetrics & Gynecology

## 2014-06-22 VITALS — BP 116/70 | HR 68 | Ht 64.0 in | Wt 117.0 lb

## 2014-06-22 DIAGNOSIS — N6001 Solitary cyst of right breast: Secondary | ICD-10-CM

## 2014-06-22 DIAGNOSIS — N6002 Solitary cyst of left breast: Secondary | ICD-10-CM

## 2014-06-22 DIAGNOSIS — N6009 Solitary cyst of unspecified breast: Secondary | ICD-10-CM

## 2014-06-23 ENCOUNTER — Encounter: Payer: Self-pay | Admitting: Obstetrics & Gynecology

## 2014-06-23 DIAGNOSIS — N6002 Solitary cyst of left breast: Secondary | ICD-10-CM

## 2014-06-23 DIAGNOSIS — N6001 Solitary cyst of right breast: Secondary | ICD-10-CM | POA: Insufficient documentation

## 2014-06-23 NOTE — Progress Notes (Signed)
Subjective:     Patient ID: Amanda Evans, female   DOB: 1961/05/10, 53 y.o.   MRN: [id number]  HPI 53 yo MWF here for follow up of right breast cyst.  Pt was initially seen 06/01/14 by Edman Circle.  Pt decided at that time to wait and watch and see if the cyst improved at all.  In fact, she thinks it might be a little larger.  Her bras aren't fitting correctly as they seem too small on the right.  Denies tenderness.  Last MMG was a 3D with PUS 10/14 showing several breast cysts.  Review of Systems  All other systems reviewed and are negative.      Objective:   Physical Exam  Constitutional: She is oriented to person, place, and time. She appears well-developed and well-nourished.  Pulmonary/Chest: Right breast exhibits mass. Right breast exhibits no skin change and no tenderness. Left breast exhibits no inverted nipple, no mass, no nipple discharge, no skin change and no tenderness. Breasts are asymmetrical (right breast vistually larger than left).    Lymphadenopathy:    She has no cervical adenopathy.    She has no axillary adenopathy.  Neurological: She is alert and oriented to person, place, and time.  Skin: Skin is warm and dry.  Psychiatric: She has a normal mood and affect.   After discussing options, pt decided to proceed with in-office breast cyst aspiration.  Skin cleansed with alcohol.  1cc 1% lidocaine used to anesthatize skin above cyst.  While holding cyst within my left hand, cyst wall was traversed with 1 1/2 inch #25 gauge needed.  17cc greenish (typical appearing) cyst fluid aspirated.  Cyst felt completely collapse at end of procedure.  Needle removed.  Minimal bleeding without any evidence of bruising noted.  Pt tolerated procedure well.  She palpated breast at end and could not feel the cyst either.    Assessment:     4.5cm right breast cyst, s/p aspiration     Plan:     No cytology sent as this had the appearance of normal cyst fluid Known hx of breast cysts Pt  knows to call with any new issues/concerns.

## 2014-06-24 ENCOUNTER — Ambulatory Visit: Payer: 59 | Admitting: Obstetrics & Gynecology

## 2014-09-06 ENCOUNTER — Encounter: Payer: Self-pay | Admitting: Obstetrics & Gynecology

## 2014-10-05 ENCOUNTER — Encounter: Payer: Self-pay | Admitting: Nurse Practitioner

## 2014-10-05 ENCOUNTER — Ambulatory Visit (INDEPENDENT_AMBULATORY_CARE_PROVIDER_SITE_OTHER): Payer: 59 | Admitting: Nurse Practitioner

## 2014-10-05 VITALS — BP 104/62 | HR 72 | Ht 64.0 in | Wt 116.0 lb

## 2014-10-05 DIAGNOSIS — Z01419 Encounter for gynecological examination (general) (routine) without abnormal findings: Secondary | ICD-10-CM

## 2014-10-05 DIAGNOSIS — Z0000 Encounter for general adult medical examination without abnormal findings: Secondary | ICD-10-CM

## 2014-10-05 DIAGNOSIS — R319 Hematuria, unspecified: Secondary | ICD-10-CM

## 2014-10-05 LAB — LIPID PANEL
Cholesterol: 208 mg/dL — ABNORMAL HIGH (ref 0–200)
HDL: 56 mg/dL (ref >39)
LDL CALC: 135 mg/dL — AB (ref 0–99)
TRIGLYCERIDES: 86 mg/dL (ref <150)
Total CHOL/HDL Ratio: 3.7 Ratio
VLDL: 17 mg/dL (ref 0–40)

## 2014-10-05 LAB — COMPREHENSIVE METABOLIC PANEL
ALK PHOS: 36 U/L — AB (ref 39–117)
ALT: 13 U/L (ref 0–35)
AST: 15 U/L (ref 0–37)
Albumin: 4.2 g/dL (ref 3.5–5.2)
BILIRUBIN TOTAL: 0.6 mg/dL (ref 0.2–1.2)
BUN: 17 mg/dL (ref 6–23)
CO2: 29 mEq/L (ref 19–32)
Calcium: 9.2 mg/dL (ref 8.4–10.5)
Chloride: 106 mEq/L (ref 96–112)
Creat: 0.75 mg/dL (ref 0.50–1.10)
Glucose, Bld: 78 mg/dL (ref 70–99)
Potassium: 4.6 mEq/L (ref 3.5–5.3)
SODIUM: 141 meq/L (ref 135–145)
TOTAL PROTEIN: 6.6 g/dL (ref 6.0–8.3)

## 2014-10-05 LAB — POCT URINALYSIS DIPSTICK
Bilirubin, UA: NEGATIVE
Glucose, UA: NEGATIVE
Ketones, UA: NEGATIVE
Nitrite, UA: NEGATIVE
PH UA: 6
Protein, UA: NEGATIVE
UROBILINOGEN UA: NEGATIVE

## 2014-10-05 LAB — HEMOGLOBIN, FINGERSTICK: Hemoglobin, fingerstick: 14 g/dL (ref 12.0–16.0)

## 2014-10-05 LAB — TSH: TSH: 3.033 u[IU]/mL (ref 0.350–4.500)

## 2014-10-05 NOTE — Patient Instructions (Signed)

## 2014-10-05 NOTE — Progress Notes (Signed)
Patient ID: Amanda Evans, female   DOB: 1961-03-08, 53 y.o.   MRN: [id number] 53 y.o. G37P1011 Married Caucasian Fe here for annual exam.  Had right abreast cyst aspiration in August.  Son who is in the [place] is getting engaged to a foreign national from [place] and she would rather him wait until after his tour of duty.   Patient's last menstrual period was 10/06/2010.          Sexually active: yes  The current method of family planning is vasectomy.  Exercising: no The patient does not participate in regular exercise at present. Smoker: no  Health Maintenance: Pap:  09/25/13, Negative; 09/23/12, ASCUS, neg HR HPV MMG: 09/04/13, Bi-Rads 2: Benign findings Colonoscopy: 09/2010 normal, repeat in 5 years BMD: never TDaP: 04/2014 Labs:  HB:  14.0  Urine:  Trace RBC, 1+ WBC   reports that she has never smoked. She has never used smokeless tobacco. She reports that she does not drink alcohol or use illicit drugs.  Past Medical History  Diagnosis Date  . STD (sexually transmitted disease)     HSV    Past Surgical History  Procedure Laterality Date  . Cesarean section    . Novasure ablation  12.2.11    in office, v-vagal response    Current Outpatient Prescriptions  Medication Sig Dispense Refill  . Ascorbic Acid (VITAMIN C PO) Take by mouth.    . Calcium-Vitamin A-Vitamin D (LIQUID CALCIUM PO) Take 1,000 mg by mouth daily.    . Ginkgo Biloba 100 MG CAPS Take 1 capsule by mouth daily.    . nitrofurantoin, macrocrystal-monohydrate, (MACROBID) 100 MG capsule 1 cap daily prn 30 capsule 5  . NON FORMULARY Origin of Life liquid multivitamin by [employer]    . Omega-3 Fatty Acids (FISH OIL) 1000 MG CAPS Take 1 capsule by mouth daily.     No current facility-administered medications for this visit.    Family History  Problem Relation Age of Onset  . Hyperlipidemia Mother   . Thyroid disease Mother   . Osteoporosis Mother   . Hyperlipidemia Father   . Heart attack Father   .  Heart disease Father   . Transient ischemic attack Father   . Sudden death Neg Hx   . Hypertension Neg Hx   . Diabetes Neg Hx   . Colon cancer Sister 41  . Breast cancer Maternal Grandmother   . Heart disease Paternal Grandmother   . Heart disease Paternal Grandfather   . Breast cancer Sister 55    mastectomy    ROS:  Pertinent items are noted in HPI.  Otherwise, a comprehensive ROS was negative.  Exam:   BP 104/62 mmHg  Pulse 72  Ht _0  (1.626 m)  Wt 116 lb (52.617 kg)  BMI 19.90 kg/m2  LMP 10/06/2010 Height: _1  (162.6 cm)  Ht Readings from Last 3 Encounters:  10/05/14 _2  (1.626 m)  06/22/14 _3  (1.626 m)  06/01/14 _4  (1.626 m)    General appearance: alert, cooperative and appears stated age Head: Normocephalic, without obvious abnormality, atraumatic Neck: no adenopathy, supple, symmetrical, trachea midline and thyroid normal to inspection and palpation Lungs: clear to auscultation bilaterally Breasts: normal appearance, no masses or tenderness Heart: regular rate and rhythm Abdomen: soft, non-tender; no masses,  no organomegaly Extremities: extremities normal, atraumatic, no cyanosis or edema Skin: Skin color, texture, turgor normal. No rashes or lesions Lymph nodes: Cervical, supraclavicular, and axillary nodes normal. No abnormal inguinal  nodes palpated Neurologic: Grossly normal   Pelvic: External genitalia:  no lesions              Urethra:  normal appearing urethra with no masses, tenderness or lesions              Bartholin's and Skene's: normal                 Vagina: normal appearing vagina with normal color and discharge, no lesions              Cervix: anteverted              Pap taken: No. 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

## 2014-10-06 LAB — URINALYSIS, MICROSCOPIC ONLY
Bacteria, UA: NONE SEEN
CASTS: NONE SEEN
Crystals: NONE SEEN

## 2014-10-06 LAB — VITAMIN D 25 HYDROXY (VIT D DEFICIENCY, FRACTURES): Vit D, 25-Hydroxy: 49 ng/mL (ref 30–100)

## 2014-10-07 LAB — URINE CULTURE

## 2014-10-10 NOTE — Progress Notes (Signed)
Encounter reviewed by Dr. Brook Silva.  

## 2014-11-12 ENCOUNTER — Encounter: Payer: Self-pay | Admitting: Obstetrics & Gynecology

## 2015-01-07 ENCOUNTER — Telehealth: Payer: Self-pay | Admitting: Nurse Practitioner

## 2015-01-07 NOTE — Telephone Encounter (Signed)
Pt left voicemail. Pt unsure if she should schedule her own mammogram or if we refer her. Pt states should be a screening mammogram at breast center. Pt is [employer] employee.  Pt gave work number at best contact at this time  [phone]

## 2015-01-07 NOTE — Telephone Encounter (Signed)
Spoke with patient. Patient states she is ready to schedule her next mammogram and " I am not sure if it is supposed to be a screening or not." Advised based up on review of report from last diagnostic mammogram and ultrasound on 09/04/2013 patient was to return for screening mammogram in one year. "I just have really dense breasts." Advised patient having a 3D screening mammogram is recommended for patient's with dense breast so that we are able to get more detailed imaging. Patient is agreeable and will call to schedule at this time.  Routing to provider for final review. Patient agreeable to disposition. Will close encounter

## 2015-01-13 ENCOUNTER — Other Ambulatory Visit: Payer: Self-pay

## 2015-01-13 DIAGNOSIS — Z1231 Encounter for screening mammogram for malignant neoplasm of breast: Secondary | ICD-10-CM

## 2015-01-27 ENCOUNTER — Ambulatory Visit: Payer: 59

## 2015-01-27 ENCOUNTER — Ambulatory Visit
Admission: RE | Admit: 2015-01-27 | Discharge: 2015-01-27 | Disposition: A | Discharge status: Home / Self Care | Payer: 59 | Source: Ambulatory Visit

## 2015-01-27 DIAGNOSIS — Z1231 Encounter for screening mammogram for malignant neoplasm of breast: Secondary | ICD-10-CM

## 2015-02-18 ENCOUNTER — Telehealth: Payer: Self-pay | Admitting: Nurse Practitioner

## 2015-02-18 NOTE — Telephone Encounter (Signed)
Left patient a message to call back to reschedule a future appointment that was cancelled by the provider. °

## 2015-10-07 ENCOUNTER — Ambulatory Visit: Payer: 59 | Admitting: Nurse Practitioner

## 2015-10-13 ENCOUNTER — Ambulatory Visit: Payer: 59 | Admitting: Nurse Practitioner

## 2015-10-19 ENCOUNTER — Encounter: Payer: Self-pay | Admitting: Nurse Practitioner

## 2015-10-19 ENCOUNTER — Ambulatory Visit (INDEPENDENT_AMBULATORY_CARE_PROVIDER_SITE_OTHER): Payer: 59 | Admitting: Nurse Practitioner

## 2015-10-19 VITALS — BP 110/72 | HR 80 | Ht 64.0 in | Wt 115.0 lb

## 2015-10-19 DIAGNOSIS — Z0000 Encounter for general adult medical examination without abnormal findings: Secondary | ICD-10-CM | POA: Diagnosis not present

## 2015-10-19 DIAGNOSIS — R110 Nausea: Secondary | ICD-10-CM | POA: Diagnosis not present

## 2015-10-19 DIAGNOSIS — Z01419 Encounter for gynecological examination (general) (routine) without abnormal findings: Secondary | ICD-10-CM

## 2015-10-19 DIAGNOSIS — N6002 Solitary cyst of left breast: Secondary | ICD-10-CM | POA: Diagnosis not present

## 2015-10-19 LAB — POCT URINALYSIS DIPSTICK
Bilirubin, UA: NEGATIVE
GLUCOSE UA: NEGATIVE
KETONES UA: NEGATIVE
Nitrite, UA: NEGATIVE
Protein, UA: NEGATIVE
Urobilinogen, UA: NEGATIVE
pH, UA: 6

## 2015-10-19 LAB — CBC WITH DIFFERENTIAL/PLATELET
Basophils Absolute: 0.1 10*3/uL (ref 0.0–0.1)
Basophils Relative: 1 % (ref 0–1)
EOS ABS: 0.1 10*3/uL (ref 0.0–0.7)
Eosinophils Relative: 2 % (ref 0–5)
HCT: 42 % (ref 36.0–46.0)
HEMOGLOBIN: 14.4 g/dL (ref 12.0–15.0)
LYMPHS ABS: 2.6 10*3/uL (ref 0.7–4.0)
Lymphocytes Relative: 50 % — ABNORMAL HIGH (ref 12–46)
MCH: 30.5 pg (ref 26.0–34.0)
MCHC: 34.3 g/dL (ref 30.0–36.0)
MCV: 89 fL (ref 78.0–100.0)
MONO ABS: 0.3 10*3/uL (ref 0.1–1.0)
MONOS PCT: 5 % (ref 3–12)
MPV: 9.2 fL (ref 8.6–12.4)
NEUTROS ABS: 2.2 10*3/uL (ref 1.7–7.7)
NEUTROS PCT: 42 % — AB (ref 43–77)
Platelets: 245 10*3/uL (ref 150–400)
RBC: 4.72 MIL/uL (ref 3.87–5.11)
RDW: 13.4 % (ref 11.5–15.5)
WBC: 5.2 10*3/uL (ref 4.0–10.5)

## 2015-10-19 LAB — COMPREHENSIVE METABOLIC PANEL
ALBUMIN: 4.3 g/dL (ref 3.6–5.1)
ALT: 12 U/L (ref 6–29)
AST: 16 U/L (ref 10–35)
Alkaline Phosphatase: 39 U/L (ref 33–130)
BILIRUBIN TOTAL: 0.8 mg/dL (ref 0.2–1.2)
BUN: 17 mg/dL (ref 7–25)
CALCIUM: 9.2 mg/dL (ref 8.6–10.4)
CHLORIDE: 103 mmol/L (ref 98–110)
CO2: 28 mmol/L (ref 20–31)
Creat: 0.8 mg/dL (ref 0.50–1.05)
Glucose, Bld: 78 mg/dL (ref 65–99)
POTASSIUM: 4.7 mmol/L (ref 3.5–5.3)
Sodium: 140 mmol/L (ref 135–146)
Total Protein: 6.6 g/dL (ref 6.1–8.1)

## 2015-10-19 LAB — LIPID PANEL
CHOL/HDL RATIO: 3.6 ratio (ref <=5.0)
Cholesterol: 218 mg/dL — ABNORMAL HIGH (ref 125–200)
HDL: 60 mg/dL (ref >=46)
LDL CALC: 142 mg/dL — AB (ref <130)
TRIGLYCERIDES: 79 mg/dL (ref <150)
VLDL: 16 mg/dL (ref <30)

## 2015-10-19 LAB — TSH: TSH: 2.587 u[IU]/mL (ref 0.350–4.500)

## 2015-10-19 MED ORDER — NITROFURANTOIN MONOHYD MACRO 100 MG PO CAPS: ORAL_CAPSULE | ORAL | Status: DC

## 2015-10-19 NOTE — Progress Notes (Addendum)
Patient ID: Amanda Evans, female   DOB: 1961/04/23, 54 y.o.   MRN: [id number]  54 y.o. [id number] Married  Caucasian Fe here for annual exam.  06/22/14 had breast cyst aspiration done here in office with Dr. Sabra Heck. She now feels there is a smaller one on the left side and wants to have this done again.  No vaso symptoms and no menses since her ablation 10/2010.  She is currently on Macrobid for UTI X 2 days. Most recently having problems with GI symptoms of nausea that seems to occur 2 hours after eating.  This has occurred for past 3 -4 weeks.  No constipation or diarrhea or relationship to foods.  No blood or mucous.  She has tried PPI without help.  She is needing a repeat colonoscopy in March for colon screening due to [facility].  Advised that we can get her in for a new provider at GI since Dr. Olevia Perches has now retired.  She has been more anxious over her mothers health.  Her son also who married in July lives in [place] state and now they are separating and will getting a divorce. He is still in the [employer] so has been out to sea most of the time.  He let her know about the wedding 2 days in advance but it was not enough time for her to go.  He also married this lady who is from [place] and she feels the cultural difference may be a part of the problem.  Patient's last menstrual period was 10/05/2010 (approximate).          Sexually active: Yes.    The current method of family planning is vasectomy.    Exercising: Yes.   Jazzercise 3 times per week, stretching every morning Smoker:  no  Health Maintenance: Pap: 09/25/13, Negative  MMG: 01/27/15, Bi-Rads 1: Negative Colonoscopy: 01/12/11, Normal, repeat in 5 years BMD: Never, will schedule in 01/2016 with mammogram TDaP: 05/05/13 Shingles: N/A Pneumonia: N/A Hep C and HIV: Will have drawn today Labs: HB: 14.2  Urine: 1+ leuk's, trace RBC (has taken Macrobid for UTI x 2 days)   reports that she has never smoked. She has never used smokeless tobacco. She  reports that she does not drink alcohol or use illicit drugs.  Past Medical History  Diagnosis Date  . STD (sexually transmitted disease)     HSV    Past Surgical History  Procedure Laterality Date  . Cesarean section    . Novasure ablation  12.2.11    in office, v-vagal response    Current Outpatient Prescriptions  Medication Sig Dispense Refill  . Ascorbic Acid (VITAMIN C PO) Take by mouth.    . Biotin 5000 MCG CAPS Take 1 capsule by mouth daily.    . Calcium-Vitamin A-Vitamin D (LIQUID CALCIUM PO) Take 1,000 mg by mouth daily.    . Ginkgo Biloba 100 MG CAPS Take 1 capsule by mouth daily.    . nitrofurantoin, macrocrystal-monohydrate, (MACROBID) 100 MG capsule 1 cap daily prn 30 capsule 5  . NON FORMULARY Origin of Life liquid multivitamin by [employer]    . Omega-3 Fatty Acids (FISH OIL) 1000 MG CAPS Take 1 capsule by mouth daily.     No current facility-administered medications for this visit.    Family History  Problem Relation Age of Onset  . Hyperlipidemia Mother   . Thyroid disease Mother   . Osteoporosis Mother   . Hyperlipidemia Father   . Heart attack Father   .  Heart disease Father   . Transient ischemic attack Father   . Sudden death Neg Hx   . Hypertension Neg Hx   . Diabetes Neg Hx   . Colon cancer Sister 76  . Breast cancer Maternal Grandmother   . Heart disease Paternal Grandmother   . Heart disease Paternal Grandfather   . Breast cancer Sister 48    mastectomy    ROS:  Pertinent items are noted in HPI.  Otherwise, a comprehensive ROS was negative.  Exam:   BP 110/72 mmHg  Pulse 80  Ht 5\' 4"  (1.626 m)  Wt 115 lb (52.164 kg)  BMI 19.73 kg/m2  LMP 10/05/2010 (Approximate) Height: 5\' 4"  (162.6 cm) Ht Readings from Last 3 Encounters:  10/19/15 5\' 4"  (1.626 m)  10/05/14 5\' 4"  (1.626 m)  06/22/14 5\' 4"  (1.626 m)    General appearance: alert, cooperative and appears stated age Head: Normocephalic, without obvious abnormality,  atraumatic Neck: no adenopathy, supple, symmetrical, trachea midline and thyroid normal to inspection and palpation Lungs: clear to auscultation bilaterally Breasts: normal appearance, no masses or tenderness, on the right with FCB changes.  On the left she has cyst at 1:00 position about 2 cm.  Will need aspiration by Dr. Sabra Heck. Heart: regular rate and rhythm Abdomen: soft, non-tender; no masses,  no organomegaly, no area of tenderness on exam Extremities: extremities normal, atraumatic, no cyanosis or edema Skin: Skin color, texture, turgor normal. No rashes or lesions Lymph nodes: Cervical, supraclavicular, and axillary nodes normal. No abnormal inguinal nodes palpated Neurologic: Grossly normal   Pelvic: External genitalia:  no lesions              Urethra:  normal appearing urethra with no masses, tenderness or lesions              Bartholin's and Skene's: normal                 Vagina: normal appearing vagina with normal color and discharge, no lesions              Cervix: anteverted              Pap taken: Yes.   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 (labs were fasting)  Will schedule her to return for left breast cyst aspiration  Counseled on breast self exam, mammography screening, adequate intake of calcium and vitamin D, diet and exercise, Kegel's exercises return annually or prn  An After  Visit Summary was printed and given to the patient.

## 2015-10-19 NOTE — Patient Instructions (Signed)

## 2015-10-20 LAB — FOLLICLE STIMULATING HORMONE: FSH: 21.6 m[IU]/mL

## 2015-10-20 LAB — HIV ANTIBODY (ROUTINE TESTING W REFLEX): HIV 1&2 Ab, 4th Generation: NONREACTIVE

## 2015-10-20 LAB — HEPATITIS C ANTIBODY: HCV Ab: NEGATIVE

## 2015-10-20 LAB — VITAMIN D 25 HYDROXY (VIT D DEFICIENCY, FRACTURES): Vit D, 25-Hydroxy: 51 ng/mL (ref 30–100)

## 2015-10-21 ENCOUNTER — Encounter: Payer: Self-pay | Admitting: Nurse Practitioner

## 2015-10-21 ENCOUNTER — Encounter: Payer: Self-pay | Admitting: Gastroenterology

## 2015-10-24 LAB — IPS PAP TEST WITH HPV

## 2015-10-24 LAB — HEMOGLOBIN, FINGERSTICK: Hemoglobin, fingerstick: 14.2 g/dL (ref 12.0–16.0)

## 2015-10-24 NOTE — Progress Notes (Signed)
Encounter reviewed Jill Jertson, MD   

## 2015-11-04 ENCOUNTER — Encounter: Payer: Self-pay | Admitting: Obstetrics & Gynecology

## 2015-11-04 ENCOUNTER — Ambulatory Visit (INDEPENDENT_AMBULATORY_CARE_PROVIDER_SITE_OTHER): Payer: 59 | Admitting: Obstetrics & Gynecology

## 2015-11-04 VITALS — BP 108/68 | HR 72 | Resp 18 | Ht 64.0 in | Wt 115.0 lb

## 2015-11-04 DIAGNOSIS — N6002 Solitary cyst of left breast: Secondary | ICD-10-CM | POA: Diagnosis not present

## 2015-11-04 NOTE — Progress Notes (Signed)
Subjective:     Patient ID: Amanda Evans, female   DOB: 07-Apr-1961, 54 y.o.   MRN: [id number]  HPI 54 yo G2P1 MWF here for possible breast cyst aspiration.  Cyst was noted in breast on physical exam done by Kem Boroughs, NP, on 10/19/15.  It was noted to be about 2cm then.  Area was non-tender but bothersome to pt.  She continues to report that it is non-tender but she feels it has enlarged.  No recent trauma.  No nipple discharge.  Last MMG 01/27/15 with 3D--negative.    Pt aware that I need to feel the area first to determine if it is large enough for aspiration.  Voices understanding.    Review of Systems  All other systems reviewed and are negative.      Objective:   Physical Exam  Constitutional: She is oriented to person, place, and time. She appears well-developed and well-nourished.  Pulmonary/Chest: Right breast exhibits no inverted nipple, no mass, no nipple discharge, no skin change and no tenderness. Left breast exhibits mass. Left breast exhibits no inverted nipple, no nipple discharge, no skin change and no tenderness. Breasts are symmetrical.    Lymphadenopathy:    She has no cervical adenopathy.  Neurological: She is alert and oriented to person, place, and time.  Psychiatric: She has a normal mood and affect.      Procedure:  ~4cm left breast cyst palpated and due to size, agree with proceeding with aspiration.  Area cleansed with Betadine x 3.  1.0cc 1% lidocaine Lot #[id number], Exp 11/06/15, used.  Using #25 gauge needle, cyst aspirated.  10cc's greenish fluid noted.  Cyst felt totally collapsed prior to removal of needle.  Aspirate will be sent to pathology.  Pt tolerated procedure well.  Assessment:     4 cm left breast cyst, s/p aspiration    Plan:     Cyst fluid will be sent to pathology and report called to pt. Pt planning on proceeding with yearly MMG with next one due in late 3/17.

## 2015-11-14 ENCOUNTER — Encounter: Payer: Self-pay | Admitting: Nurse Practitioner

## 2015-11-14 ENCOUNTER — Telehealth: Payer: Self-pay

## 2015-11-14 NOTE — Telephone Encounter (Signed)
Non-Urgent Medical Question  Message [id number]   From  DYNISTY SAPIA   To  Kem Boroughs, FNP   Sent  11/14/2015 11:40 AM     I would like my summary to reflect that WAS indeed fasting on the day of this test please!! Thank you, Amanda Evans      Responsible Party    Pool - [facility] Clinical Pool No one has taken responsibility for this message.     No actions have been taken on this message.     Spoke with patient. Advised of results as seen below from Louise. Patient is agreeable and verbalizes understanding. Advised I will notify Kem Boroughs, FNP of mychart message as seen above so this change can be made to her OV note. Patient is agreeable.  Cc: Kem Boroughs, FNP  Routing to provider for final review. Patient agreeable to disposition. Will close encounter.

## 2015-11-14 NOTE — Telephone Encounter (Signed)
Spoke with patient. Please see telephone encounter

## 2015-11-14 NOTE — Telephone Encounter (Signed)
-----   Message from Megan Salon, MD sent at 11/09/2015  3:42 PM EST ----- Inform pt that the breast cyst fluid came back as benign breast cyst fluid.  Nothing else was seen in the fluid.  Nothing else needs to be done unless the cyst occurs again.

## 2015-11-28 ENCOUNTER — Telehealth: Payer: Self-pay | Admitting: Nurse Practitioner

## 2015-11-28 DIAGNOSIS — Z803 Family history of malignant neoplasm of breast: Secondary | ICD-10-CM

## 2015-11-28 NOTE — Telephone Encounter (Signed)
Per DPR able to leave a detailed message.  She is asked if the (1/2) sister who has breast cancer is maternal or paternal side of family.  The MGM had breast cancer as well.  Trying to determine if we need to get BRCA testing.  She is asked to call back and ask for phone nurse or Colletta [place].  Then we can see if need to proceed.

## 2015-11-30 NOTE — Telephone Encounter (Signed)
MGM with breast cancer.  Maternal 1/2 sister with breast cancer.  Also has a full sister with history of colon cancer.  Interested in also hereditary cancer testing also.

## 2015-11-30 NOTE — Telephone Encounter (Signed)
Please let patient know that now with this information that the 1/2 sister is maternal side of family we do want her to see genetic counselor.

## 2015-12-01 ENCOUNTER — Telehealth: Payer: Self-pay | Admitting: Genetic Counselor

## 2015-12-01 NOTE — Addendum Note (Signed)
Addended by: Jasmine Awe on: 12/01/2015 02:08 PM   Modules accepted: Orders

## 2015-12-01 NOTE — Telephone Encounter (Signed)
genetic appt-s/w Kaitlyn @ [facility] and gave appt for 02/09 @ 10 w/kayla boggs

## 2015-12-01 NOTE — Telephone Encounter (Signed)
Spoke with patient. Advised of message as seen below from Amanda Evans, [place]. Patient is agreeable. Advised I have scheduled her for genetics counseling with [facility] on their first available day 12/15/2015 at 10 am. Patient is agreeable to date and time.  Routing to provider for final review. Patient agreeable to disposition. Will close encounter.

## 2015-12-15 ENCOUNTER — Encounter: Payer: Self-pay | Admitting: Genetic Counselor

## 2015-12-15 ENCOUNTER — Ambulatory Visit (HOSPITAL_BASED_OUTPATIENT_CLINIC_OR_DEPARTMENT_OTHER): Payer: 59 | Admitting: Genetic Counselor

## 2015-12-15 ENCOUNTER — Other Ambulatory Visit: Payer: 59

## 2015-12-15 DIAGNOSIS — Z808 Family history of malignant neoplasm of other organs or systems: Secondary | ICD-10-CM

## 2015-12-15 DIAGNOSIS — Z801 Family history of malignant neoplasm of trachea, bronchus and lung: Secondary | ICD-10-CM

## 2015-12-15 DIAGNOSIS — Z803 Family history of malignant neoplasm of breast: Secondary | ICD-10-CM

## 2015-12-15 DIAGNOSIS — Z8489 Family history of other specified conditions: Secondary | ICD-10-CM | POA: Diagnosis not present

## 2015-12-15 DIAGNOSIS — Z800 Family history of malignant neoplasm of digestive organs: Secondary | ICD-10-CM | POA: Diagnosis not present

## 2015-12-15 DIAGNOSIS — Z315 Encounter for genetic counseling: Secondary | ICD-10-CM | POA: Diagnosis not present

## 2015-12-15 DIAGNOSIS — Z8042 Family history of malignant neoplasm of prostate: Secondary | ICD-10-CM

## 2015-12-15 DIAGNOSIS — Z8043 Family history of malignant neoplasm of testis: Secondary | ICD-10-CM

## 2015-12-15 NOTE — Progress Notes (Signed)
REFERRING PROVIDER: Kem Boroughs, FNP  PRIMARY PROVIDER:  Stephens Shire, MD  PRIMARY REASON FOR VISIT:  1. Family history of breast cancer in female   2. Family history of colon cancer   3. Family history of brain tumor   4. Family history of prostate cancer in father   47. Family history of melanoma   6. Family history of lung cancer   7. Family history of testicular cancer      HISTORY OF PRESENT ILLNESS:   Amanda Evans, a 55 y.o. female, was seen for a [place] cancer genetics consultation at the request of Kem Boroughs, FNP due to a family history of early-onset breast cancer and colon and other cancers.  Amanda Evans presents to clinic today to discuss the possibility of a hereditary predisposition to cancer, genetic testing, and to further clarify her future cancer risks, as well as potential cancer risks for family members.   Amanda Evans is a 55 y.o. female with no personal history of cancer.  She has a personal history of bilateral breast cysts and dense breast tissue.    CANCER HISTORY:   No history exists.     HORMONAL RISK FACTORS:  Menarche was at age 76.  First live birth at age 83.  OCP use for approximately 15-20 years.  Ovaries intact: yes.  Hysterectomy: no.  Menopausal status: postmenopausal - NovaSure ablation in 2011 HRT use: 0 years. Colonoscopy: yes, in 2012 (following sister's dx of colon cancer); normal. Mammogram within the last year: yes. Number of breast biopsies: 0, but hx of aspiration Up to date with pelvic exams:  yes. Any excessive radiation exposure in the past:  no  Past Medical History  Diagnosis Date  . STD (sexually transmitted disease)     HSV  . Urinary incontinence     Past Surgical History  Procedure Laterality Date  . Cesarean section    . Novasure ablation  12.2.11    in office, v-vagal response    Social History   Social History  . Marital Status: Married    Spouse Name: N/A  . Number of Children: 1  . Years of  Education: N/A   Occupational History  . [employer]   Social History Main Topics  . Smoking status: Never Smoker   . Smokeless tobacco: Never Used  . Alcohol Use: Yes     Comment: 3 glasses wine per month  . Drug Use: No  . Sexual Activity: Yes   Other Topics Concern  . None   Social History Narrative     FAMILY HISTORY:  We obtained a detailed, 4-generation family history.  Significant diagnoses are listed below: Family History  Problem Relation Age of Onset  . Hyperlipidemia Mother   . Thyroid disease Mother   . Osteoporosis Mother   . Hyperlipidemia Father   . Heart attack Father   . Heart disease Father   . Transient ischemic attack Father   . Prostate cancer Father     dx. 86-71  . Melanoma Father     dx. >10  . Sudden death Neg Hx   . Hypertension Neg Hx   . Diabetes Neg Hx   . Colon cancer Sister     dx. 58-59  . Breast cancer Maternal Grandmother     dx. 61 or younger  . Heart disease Paternal Grandmother   . Heart disease Paternal Grandfather   . Breast cancer Sister 53    mastectomy  . Brain cancer  Maternal Uncle     unspecified type  . Cirrhosis Maternal Uncle     +EtOH  . Heart Problems Paternal Uncle   . Heart Problems Maternal Grandfather   . Testicular cancer Maternal Uncle     dx. early 64s  . Lung cancer Cousin     d. late 32s; smoker  . Testicular cancer Cousin 40  . Melanoma Cousin 84    Amanda Evans has one son who is currently 18.  She has one full sister who passed away from metastatic colon cancer at the age of 47.  Amanda Evans also has one maternal half-sister, currently 29, who was diagnosed with breast cancer and underwent mastectomy at the age of 36.  Amanda Evans has limited contact with this sister and is not aware that she has had genetic testing previously.  This sister has one son and one daughter.  Amanda Evans mother is currently 76 and has never had cancer.  Amanda Evans father is currently 88 and has a history of  prostate cancer diagnosed at age 57-71.  He also has a history of melanoma, which was diagnosed after his prostate cancer.  Amanda Evans mother has three full brothers and one full sister.  One brother died of a brain tumor and liver cirrhosis at the age of 102.  He had four sons and one daughter.  Two of his sons also had cancer--one was diagnosed with testicular cancer at 59 and another (a smoker) died of lung cancer in his late 47s.  A second maternal uncle is currently in his early 72s and also has a history of testicular cancer, diagnosed in his early 49s.  The other maternal uncle and aunt are currently in their mid-late 76s and have never had cancer.  Amanda Evans maternal grandmother died of breast cancer at 32.  Amanda Evans maternal grandfather died of heart-related problems in his mid-4s.  She has no further information for any maternal great aunts/uncles or great grandfathers.  Amanda Evans father has one full brother.  This brother died of heart problems and/or stroke at the age of 76.  He had only one daughter and this daughter had a history of melanoma at the age of 51.  Amanda Evans paternal grandmother died of heart disease at 70.  Her grandfather died when she was 71 years old and she has no further information for him.  Amanda Evans has no further information for any of her paternal great aunts/uncles or great grandparents.    Patient's maternal ancestors are of Caucasian descent, and paternal ancestors are of Dutch/Irish/English descent. There is no reported Ashkenazi Jewish ancestry. There is no known consanguinity.  GENETIC COUNSELING ASSESSMENT: Amanda Evans is a 55 y.o. female with a family history of cancer which is somewhat suggestive of a hereditary cancer syndrome and predisposition to cancer. We, therefore, discussed and recommended the following at today's visit.   DISCUSSION: We reviewed the characteristics, features and inheritance patterns of hereditary cancer syndromes,  particularly those caused by mutations within the BRCA1/2, CHEK2, and Lynch syndrome genes. We also discussed genetic testing, including the appropriate family members to test, the process of testing, insurance coverage and turn-around-time for results. We discussed the implications of a negative, positive and/or variant of uncertain significant result. We recommended Amanda Evans pursue genetic testing for the 20-gene Breast/Ovarian Cancer Panel through [facility].  The Breast/Ovarian Cancer Panel offered by [facility] Hope Pigeon, MD) includes sequencing and deletion/duplication analysis for the following 19 genes:  ATM, BARD1, BRCA1, BRCA2, BRIP1, CDH1, CHEK2, FANCC, MLH1, MSH2, MSH6, NBN, PALB2, PMS2, PTEN, RAD51C, RAD51D, TP53, and XRCC2.  This panel also includes deletion/duplication analysis (without sequencing) for one gene, EPCAM.   Based on Amanda Evans's family history of cancer, she meets medical criteria for genetic testing. Despite that she meets criteria, she may still have an out of pocket cost. We discussed that if her out of pocket cost for testing is over $100, the laboratory will call and confirm whether she wants to proceed with testing.  If the out of pocket cost of testing is less than $100 she will be billed by the genetic testing laboratory.   Based on the patient's personal and family history, a statistical model (Tyrer-Cuzick/IBIS)  and literature data were used to estimate her risk of developing breast cancer. These estimate her lifetime risk of developing breast cancer to be approximately 11.5% to 15.9%. This estimation does not take into account any genetic testing results.  The patient's lifetime breast cancer risk is a preliminary estimate based on available information using one of several models endorsed by the [place] (ACS). The ACS recommends consideration of breast MRI screening as an adjunct to mammography for patients at high risk (defined as  20% or greater lifetime risk). A more detailed breast cancer risk assessment can be considered, if clinically indicated.   PLAN: After considering the risks, benefits, and limitations, Amanda Evans  provided informed consent to pursue genetic testing and the blood sample was sent to [facility] for analysis of the 20-gene Breast/Ovarian Cancer Panel test. Results should be available within approximately 2-3 weeks' time, at which point they will be disclosed by telephone to Ms. Broers, as will any additional recommendations warranted by these results. Ms. Grenfell will receive a summary of her genetic counseling visit and a copy of her results once available. This information will also be available in Epic. We encouraged Ms. Remley to remain in contact with cancer genetics annually so that we can continuously update the family history and inform her of any changes in cancer genetics and testing that may be of benefit for her family. Ms. Kauth questions were answered to her satisfaction today. Our contact information was provided should additional questions or concerns arise.  Thank you for the referral and allowing [place] to share in the care of your patient.   Jeanine Luz, MS Genetic Counselor [email or web address] Phone: [phone]  The patient was seen for a total of 60 minutes in face-to-face genetic counseling.  This patient was discussed with Drs. Magrinat, Lindi Adie and/or Burr Medico who agrees with the above.    _______________________________________________________________________ For Office Staff:  Number of people involved in session: 1 Was an Intern/ student involved with case: no

## 2015-12-22 ENCOUNTER — Telehealth: Payer: Self-pay | Admitting: Nurse Practitioner

## 2015-12-22 ENCOUNTER — Other Ambulatory Visit: Payer: Self-pay

## 2015-12-22 DIAGNOSIS — Z1231 Encounter for screening mammogram for malignant neoplasm of breast: Secondary | ICD-10-CM

## 2015-12-22 DIAGNOSIS — N6002 Solitary cyst of left breast: Secondary | ICD-10-CM

## 2015-12-22 DIAGNOSIS — N6001 Solitary cyst of right breast: Secondary | ICD-10-CM

## 2015-12-22 NOTE — Telephone Encounter (Signed)
Dr.Miller, patient was seen in office on 11/04/2015 for aspiration of right breast cyst. Fluid returned as benign breast cyst fluid. Her last mammogram was performed in 01/2015. She is due for her mammogram at this time and was instructed she will need a diagnostic mammogram due to her breast cyst. Okay to order bilateral diagnostic imaging for this patient?

## 2015-12-22 NOTE — Telephone Encounter (Signed)
Patient wants to speak with the nurse. Patient has a cyst in right breast and was trying to schedule her mammogram. She states she was told they need a referral for a diagnostic mammogram.

## 2015-12-23 ENCOUNTER — Other Ambulatory Visit: Payer: Self-pay | Admitting: Obstetrics & Gynecology

## 2015-12-23 DIAGNOSIS — N6002 Solitary cyst of left breast: Secondary | ICD-10-CM

## 2015-12-23 NOTE — Addendum Note (Signed)
Addended by: Jasmine Awe on: 12/23/2015 04:50 PM   Modules accepted: Orders

## 2015-12-23 NOTE — Telephone Encounter (Signed)
Spoke with patient. Advised [facility] has been placed [facility]. She is agreeable and will call to schedule her appointment at this time.  Routing to provider for final review. Patient agreeable to disposition. Will close encounter.

## 2015-12-23 NOTE — Telephone Encounter (Signed)
Yes.  That is fine.  Thank you. 

## 2015-12-28 ENCOUNTER — Ambulatory Visit: Payer: 59 | Admitting: Gastroenterology

## 2015-12-30 ENCOUNTER — Encounter: Payer: Self-pay | Admitting: Gastroenterology

## 2015-12-30 ENCOUNTER — Ambulatory Visit (INDEPENDENT_AMBULATORY_CARE_PROVIDER_SITE_OTHER): Payer: 59 | Admitting: Gastroenterology

## 2015-12-30 ENCOUNTER — Ambulatory Visit: Payer: 59 | Admitting: Gastroenterology

## 2015-12-30 VITALS — BP 108/74 | HR 92 | Ht 63.5 in | Wt 112.5 lb

## 2015-12-30 DIAGNOSIS — Z800 Family history of malignant neoplasm of digestive organs: Secondary | ICD-10-CM | POA: Diagnosis not present

## 2015-12-30 MED ORDER — NA SULFATE-K SULFATE-MG SULF 17.5-3.13-1.6 GM/177ML PO SOLN: 1.0000 | Freq: Once | ORAL | Status: DC

## 2015-12-30 MED FILL — SUPREP BOWEL PREP KIT: 17.5-3.13-1 | 1 days supply | Qty: 354 | Fill #0

## 2015-12-30 NOTE — Patient Instructions (Signed)

## 2016-01-06 MED FILL — NITROFURANTOIN MONO-MCR 100: 100 | 30 days supply | Qty: 30 | Fill #0

## 2016-01-13 ENCOUNTER — Ambulatory Visit (AMBULATORY_SURGERY_CENTER): Payer: 59 | Admitting: Gastroenterology

## 2016-01-13 ENCOUNTER — Encounter: Payer: Self-pay | Admitting: Gastroenterology

## 2016-01-13 VITALS — BP 115/64 | HR 78 | Temp 99.1°F | Resp 23 | Ht 63.0 in | Wt 112.0 lb

## 2016-01-13 DIAGNOSIS — Z86010 Personal history of colonic polyps: Secondary | ICD-10-CM | POA: Diagnosis present

## 2016-01-13 DIAGNOSIS — Z800 Family history of malignant neoplasm of digestive organs: Secondary | ICD-10-CM

## 2016-01-13 MED ORDER — SODIUM CHLORIDE 0.9 % IV SOLN: 500.0000 mL | INTRAVENOUS | Status: DC

## 2016-01-13 NOTE — Progress Notes (Signed)
A/ox3, pleased with MAC, report to RN 

## 2016-01-13 NOTE — Op Note (Signed)
[facility] Patient Name: Amanda Evans Procedure Date: 01/13/2016 11:12 AM MRN: [id number] Endoscopist: Mauri Pole , MD Age: 55 Referring MD:  Date of Birth: 09-Sep-1961 Gender: Female Procedure:                Colonoscopy Indications:              Screening in patient at increased risk: Family                            history of 1st-degree relative with colorectal                            cancer before age 84 years, High risk colon cancer                            surveillance: Personal history of colonic polyps Medicines:                Propofol total dose 250 mg IV, Monitored Anesthesia                            Care Procedure:                Pre-Anesthesia Assessment:                           - Prior to the procedure, a History and Physical                            was performed, and patient medications and                            allergies were reviewed. The patient's tolerance of                            previous anesthesia was also reviewed. The risks                            and benefits of the procedure and the sedation                            options and risks were discussed with the patient.                            All questions were answered, and informed consent                            was obtained. Prior Anticoagulants: The patient has                            taken no previous anticoagulant or antiplatelet                            agents. ASA Grade Assessment: I - A normal, healthy  patient. After reviewing the risks and benefits,                            the patient was deemed in satisfactory condition to                            undergo the procedure.                           After obtaining informed consent, the colonoscope                            was passed under direct vision. Throughout the                            procedure, the patient's blood pressure, pulse, and              oxygen saturations were monitored continuously. The                            Model CF-HQ190L [id number]) scope was introduced                            through the anus and advanced to the the terminal                            ileum, with identification of the appendiceal                            orifice and IC valve. The colonoscopy was performed                            without difficulty. The patient tolerated the                            procedure well. The quality of the bowel                            preparation was good. The terminal ileum, ileocecal                            valve, appendiceal orifice, and rectum were                            photographed. Scope In: 11:15:43 AM Scope Out: 11:30:33 AM Scope Withdrawal Time: 0 hours 7 minutes 47 seconds  Total Procedure Duration: 0 hours 14 minutes 50 seconds  Findings:      The perianal and digital rectal examinations were normal.      Non-bleeding internal hemorrhoids were found during retroflexion. The       hemorrhoids were small.      The exam was otherwise without abnormality. Complications:            No immediate complications. Estimated Blood Loss:     Estimated blood loss: none. Impression:               -  Non-bleeding internal hemorrhoids.                           - The examination was otherwise normal.                           - No specimens collected. Recommendation:           - Patient has a contact number available for                            emergencies. The signs and symptoms of potential                            delayed complications were discussed with the                            patient. Return to normal activities tomorrow.                            Written discharge instructions were provided to the                            patient.                           - Resume previous diet.                           - Continue present medications.                           -  Repeat colonoscopy in 5 years for surveillance                            given your significant family h/o colon cancer Procedure Code(s):        --- Professional ---                           G0105, Colorectal cancer screening; colonoscopy on                            individual at high risk CPT copyright 2016 American Medical Association. All rights reserved. Mauri Pole, MD 01/13/2016 11:35:21 AM This report has been signed electronically. Number of Addenda: 0

## 2016-01-13 NOTE — Patient Instructions (Signed)
YOU HAD AN ENDOSCOPIC PROCEDURE TODAY AT [facility]:   Refer to the procedure report that was given to you for any specific questions about what was found during the examination.  If the procedure report does not answer your questions, please call your gastroenterologist to clarify.  If you requested that your care partner not be given the details of your procedure findings, then the procedure report has been included in a sealed envelope for you to review at your convenience later.  YOU SHOULD EXPECT: Some feelings of bloating in the abdomen. Passage of more gas than usual.  Walking can help get rid of the air that was put into your GI tract during the procedure and reduce the bloating. If you had a lower endoscopy (such as a colonoscopy or flexible sigmoidoscopy) you may notice spotting of blood in your stool or on the toilet paper. If you underwent a bowel prep for your procedure, you may not have a normal bowel movement for a few days.  Please Note:  You might notice some irritation and congestion in your nose or some drainage.  This is from the oxygen used during your procedure.  There is no need for concern and it should clear up in a day or so.  SYMPTOMS TO REPORT IMMEDIATELY:   Following lower endoscopy (colonoscopy or flexible sigmoidoscopy):  Excessive amounts of blood in the stool  Significant tenderness or worsening of abdominal pains  Swelling of the abdomen that is new, acute  Fever of 100F or higher  For urgent or emergent issues, a gastroenterologist can be reached at any hour by calling [phone].   DIET: Your first meal following the procedure should be a small meal and then it is ok to progress to your normal diet. Heavy or fried foods are harder to digest and may make you feel nauseous or bloated.  Likewise, meals heavy in dairy and vegetables can increase bloating.  Drink plenty of fluids but you should avoid alcoholic beverages for 24  hours.  ACTIVITY:  You should plan to take it easy for the rest of today and you should NOT DRIVE or use heavy machinery until tomorrow (because of the sedation medicines used during the test).    FOLLOW UP: Our staff will call the number listed on your records the next business day following your procedure to check on you and address any questions or concerns that you may have regarding the information given to you following your procedure. If we do not reach you, we will leave a message.  However, if you are feeling well and you are not experiencing any problems, there is no need to return our call.  We will assume that you have returned to your regular daily activities without incident.  Hemorrhoid information given.  If any biopsies were taken you will be contacted by phone or by letter within the next 1-3 weeks.  Please call us at [phone] if you have not heard about the biopsies in 3 weeks.    SIGNATURES/CONFIDENTIALITY: You and/or your care partner have signed paperwork which will be entered into your electronic medical record.  These signatures attest to the fact that that the information above on your After Visit Summary has been reviewed and is understood.  Full responsibility of the confidentiality of this discharge information lies with you and/or your care-partner.

## 2016-01-16 ENCOUNTER — Telehealth: Payer: Self-pay | Admitting: *Deleted

## 2016-01-16 NOTE — Telephone Encounter (Signed)
Name identifier, left message, follow-up 

## 2016-02-01 NOTE — Progress Notes (Signed)
Amanda Evans    [id number]    12-21-1960  Primary Care Physician:BURNETT,BRENT A, MD  Referring Physician: Stephens Shire, MD [address] [place], [place] [address]  Chief complaint:  Nausea   HPI: 55 year old female previously followed by Dr. Olevia Perches here to establish care with me. She has significant family history of per colon cancer in her sister at age 53 and also family history of breast cancer. Her last colonoscopy in March 2012 was normal. She had nausea around the time of holidays but that has since resolved, thinks it was probably related to stress. Denies any abdominal pain, heartburn, dysphagia, odynophagia, constipation, diarrhea, melena or blood per rectum. She is feeling well overall.     Outpatient Encounter Prescriptions as of 12/30/2015  Medication Sig  . Ascorbic Acid (VITAMIN C PO) Take by mouth.  . Biotin 5000 MCG CAPS Take 1 capsule by mouth daily.  . Calcium-Vitamin A-Vitamin D (LIQUID CALCIUM PO) Take 1,000 mg by mouth daily.  . Ginkgo Biloba 100 MG CAPS Take 1 capsule by mouth daily.  . nitrofurantoin, macrocrystal-monohydrate, (MACROBID) 100 MG capsule 1 cap daily prn (Patient not taking: Reported on 01/13/2016)  . NON FORMULARY Origin of Life liquid multivitamin by [employer]  . Omega-3 Fatty Acids (FISH OIL) 1000 MG CAPS Take 1 capsule by mouth daily.  . [DISCONTINUED] Na Sulfate-K Sulfate-Mg Sulf (SUPREP BOWEL PREP) SOLN Take 1 kit by mouth once.   No facility-administered encounter medications on file as of 12/30/2015.    Allergies as of 12/30/2015 - Review Complete 12/30/2015  Allergen Reaction Noted  . Naproxen sodium  08/17/2010  . Penicillins  08/24/2009    Past Medical History  Diagnosis Date  . STD (sexually transmitted disease)     HSV  . Urinary incontinence   . HLD (hyperlipidemia)     Past Surgical History  Procedure Laterality Date  . Cesarean section    . Novasure ablation  12.2.11    in office,  v-vagal response    Family History  Problem Relation Age of Onset  . Hyperlipidemia Mother   . Thyroid disease Mother   . Osteoporosis Mother   . Hyperlipidemia Father   . Heart attack Father   . Heart disease Father   . Transient ischemic attack Father   . Prostate cancer Father     dx. 64-71  . Melanoma Father     dx. >55  . Sudden death Neg Hx   . Hypertension Neg Hx   . Diabetes Neg Hx   . Colon cancer Sister     dx. 58-59  . Breast cancer Maternal Grandmother     dx. 56 or younger  . Heart disease Paternal Grandmother   . Heart disease Paternal Grandfather   . Breast cancer Sister 42    mastectomy  . Brain cancer Maternal Uncle     unspecified type  . Cirrhosis Maternal Uncle     +EtOH  . Heart Problems Paternal Uncle   . Heart Problems Maternal Grandfather   . Testicular cancer Maternal Uncle     dx. early 96s  . Lung cancer Cousin     d. late 49s; smoker  . Testicular cancer Cousin 62  . Melanoma Cousin 47    Social History   Social History  . Marital Status: Married    Spouse Name: N/A  . Number of Children: 1  . Years of Education: N/A  Occupational History  . [employer]   Social History Main Topics  . Smoking status: Never Smoker   . Smokeless tobacco: Never Used  . Alcohol Use: Yes     Comment: 3 glasses wine per month  . Drug Use: No  . Sexual Activity: Yes   Other Topics Concern  . Not on file   Social History Narrative      Review of systems: Review of Systems  Constitutional: Negative for fever and chills.  HENT: Negative.   Eyes: Negative for blurred vision.  Respiratory: Negative for cough, shortness of breath and wheezing.   Cardiovascular: Negative for chest pain and palpitations.  Gastrointestinal: as per HPI Genitourinary: Negative for dysuria, urgency, frequency and hematuria.  Musculoskeletal: Negative for myalgias, back pain and joint pain.  Skin: Negative for itching and rash.  Neurological:  Negative for dizziness, tremors, focal weakness, seizures and loss of consciousness.  Endo/Heme/Allergies: Negative for environmental allergies.  Psychiatric/Behavioral: Negative for depression, suicidal ideas and hallucinations.  All other systems reviewed and are negative.   Physical Exam: Filed Vitals:   12/30/15 0901  BP: 108/74  Pulse: 92   Gen:      No acute distress HEENT:  EOMI, sclera anicteric Neck:     No masses; no thyromegaly Lungs:    Clear to auscultation bilaterally; normal respiratory effort CV:         Regular rate and rhythm; no murmurs Abd:      + bowel sounds; soft, non-tender; no palpable masses, no distension Ext:    No edema; adequate peripheral perfusion Skin:      Warm and dry; no rash Neuro: alert and oriented x 3 Psych: normal mood and affect  Data Reviewed:   Reviewed relevant past GI workup   Assessment and Plan/Recommendations: 89 yr F with family h/o breast and colon cancer is here to establish care Nausea has since resolved and patient currently has no complaints She is due for screening colonoscopy given her family h/o colon cancer Return as needed  Damaris Hippo , MD [phone] Mon-Fri 8a-5p [phone] after 5p, weekends, holidays

## 2016-02-09 ENCOUNTER — Ambulatory Visit
Admission: RE | Admit: 2016-02-09 | Discharge: 2016-02-09 | Disposition: A | Discharge status: Home / Self Care | Payer: 59 | Source: Ambulatory Visit | Attending: Obstetrics & Gynecology | Admitting: Obstetrics & Gynecology

## 2016-02-09 DIAGNOSIS — R928 Other abnormal and inconclusive findings on diagnostic imaging of breast: Secondary | ICD-10-CM | POA: Diagnosis not present

## 2016-02-09 DIAGNOSIS — N6002 Solitary cyst of left breast: Secondary | ICD-10-CM

## 2016-02-09 DIAGNOSIS — N6012 Diffuse cystic mastopathy of left breast: Secondary | ICD-10-CM | POA: Diagnosis not present

## 2016-02-10 ENCOUNTER — Encounter: Payer: Self-pay | Admitting: Nurse Practitioner

## 2016-02-13 ENCOUNTER — Telehealth: Payer: Self-pay

## 2016-02-13 NOTE — Telephone Encounter (Signed)
Non-Urgent Medical Question  Message [id number]   From  TASHENA LENARD   To  Kem Boroughs, FNP   Sent  02/10/2016 1:50 PM     Hi Ms. [name], I was just wondering if you could give me any references to plastic surgeons for breast lift and tummy tuck? Looking forward to hearing from you.  Thanks,  Amanda Evans      Responsible Party    Pool - [facility] Clinical Pool No one has taken responsibility for this message.     No actions have been taken on this message.     Routing to Kem Boroughs, FNP for review and advise.

## 2016-02-14 NOTE — Telephone Encounter (Signed)
There is plastic surgeon in [place] with [facility] - Dr. Filomena Jungling who is head of her department and is very good.  She would be a good match.

## 2016-02-14 NOTE — Telephone Encounter (Signed)
Spoke with patient. Advised of message as seen below from Kem Boroughs, [place]. She is agreeable and verbalizes understanding. Provided telephone number to Dr. Filomena Jungling at [facility] [phone]. Patient will contact to schedule consult.  Routing to provider for final review. Patient agreeable to disposition. Will close encounter.

## 2016-04-10 DIAGNOSIS — H5213 Myopia, bilateral: Secondary | ICD-10-CM | POA: Diagnosis not present

## 2016-07-10 MED FILL — NITROFURANTOIN MONO-MCR 100: 100 | 30 days supply | Qty: 30 | Fill #1

## 2016-08-06 ENCOUNTER — Encounter: Payer: Self-pay | Admitting: Nurse Practitioner

## 2016-11-07 ENCOUNTER — Ambulatory Visit (INDEPENDENT_AMBULATORY_CARE_PROVIDER_SITE_OTHER): Payer: 59 | Admitting: Nurse Practitioner

## 2016-11-07 ENCOUNTER — Encounter: Payer: Self-pay | Admitting: Nurse Practitioner

## 2016-11-07 ENCOUNTER — Other Ambulatory Visit: Payer: Self-pay

## 2016-11-07 VITALS — BP 116/70 | HR 80 | Resp 16 | Ht 63.75 in | Wt 112.0 lb

## 2016-11-07 DIAGNOSIS — N6320 Unspecified lump in the left breast, unspecified quadrant: Secondary | ICD-10-CM

## 2016-11-07 DIAGNOSIS — R319 Hematuria, unspecified: Secondary | ICD-10-CM

## 2016-11-07 DIAGNOSIS — Z800 Family history of malignant neoplasm of digestive organs: Secondary | ICD-10-CM | POA: Diagnosis not present

## 2016-11-07 DIAGNOSIS — Z0000 Encounter for general adult medical examination without abnormal findings: Secondary | ICD-10-CM | POA: Diagnosis not present

## 2016-11-07 DIAGNOSIS — Z803 Family history of malignant neoplasm of breast: Secondary | ICD-10-CM | POA: Diagnosis not present

## 2016-11-07 DIAGNOSIS — Z01419 Encounter for gynecological examination (general) (routine) without abnormal findings: Secondary | ICD-10-CM | POA: Diagnosis not present

## 2016-11-07 LAB — CBC
HCT: 44.2 % (ref 35.0–45.0)
Hemoglobin: 14.7 g/dL (ref 11.7–15.5)
MCH: 30.2 pg (ref 27.0–33.0)
MCHC: 33.3 g/dL (ref 32.0–36.0)
MCV: 90.9 fL (ref 80.0–100.0)
MPV: 9.2 fL (ref 7.5–12.5)
PLATELETS: 253 10*3/uL (ref 140–400)
RBC: 4.86 MIL/uL (ref 3.80–5.10)
RDW: 13.4 % (ref 11.0–15.0)
WBC: 7.1 10*3/uL (ref 3.8–10.8)

## 2016-11-07 LAB — POCT URINALYSIS DIPSTICK
Bilirubin, UA: NEGATIVE
GLUCOSE UA: NEGATIVE
KETONES UA: NEGATIVE
Nitrite, UA: NEGATIVE
PROTEIN UA: NEGATIVE
Urobilinogen, UA: NEGATIVE
pH, UA: 6

## 2016-11-07 LAB — LIPID PANEL
CHOLESTEROL: 202 mg/dL — AB (ref <200)
HDL: 58 mg/dL (ref >50)
LDL CALC: 117 mg/dL — AB (ref <100)
TRIGLYCERIDES: 135 mg/dL (ref <150)
Total CHOL/HDL Ratio: 3.5 Ratio (ref <5.0)
VLDL: 27 mg/dL (ref <30)

## 2016-11-07 LAB — COMPREHENSIVE METABOLIC PANEL
ALBUMIN: 4.3 g/dL (ref 3.6–5.1)
ALK PHOS: 51 U/L (ref 33–130)
ALT: 10 U/L (ref 6–29)
AST: 14 U/L (ref 10–35)
BUN: 11 mg/dL (ref 7–25)
CALCIUM: 9.2 mg/dL (ref 8.6–10.4)
CO2: 20 mmol/L (ref 20–31)
CREATININE: 0.81 mg/dL (ref 0.50–1.05)
Chloride: 102 mmol/L (ref 98–110)
Glucose, Bld: 86 mg/dL (ref 65–99)
Potassium: 4.5 mmol/L (ref 3.5–5.3)
SODIUM: 139 mmol/L (ref 135–146)
TOTAL PROTEIN: 6.7 g/dL (ref 6.1–8.1)
Total Bilirubin: 0.4 mg/dL (ref 0.2–1.2)

## 2016-11-07 LAB — TSH: TSH: 4.44 mIU/L

## 2016-11-07 MED ORDER — NITROFURANTOIN MONOHYD MACRO 100 MG PO CAPS: ORAL_CAPSULE | ORAL | 5 refills | Status: DC

## 2016-11-07 MED FILL — NITROFURANTOIN MONO-MCR 100: 100 | 30 days supply | Qty: 30 | Fill #0

## 2016-11-07 NOTE — Progress Notes (Signed)
Patient ID: Amanda Evans, female   DOB: [birth year], 56 y.o.   MRN: [id number]  56 y.o. [id number] Married  Caucasian Fe here for annual exam. When laying on her back and turns to right side will feel pain in the flank area.  No history of renal calculi, no dysuria, fever/ chills/ N/V. She thinks the back pain may be muscular related.  She has found a new lump that she thinks needs another aspiration.  Initially it was very tender now a little better but still large.  Previous breast cyst aspiration done here and at [facility] with US guided.  She also went for genetic counseling last year  She is having a lot more stress with caring for parents, father now 24 and mother age 21.  Father was in the hospital with dehydration for a week over Christmas.  Mother has some dementia. Son who was deployed in [place] is now back.  The divorce is not yet final.  Patient's last menstrual period was 10/05/2010 (approximate).          Sexually active: Yes.    The current method of family planning is vasectomy Exercising: Yes.    aerobics, strenght training Smoker:  no  Health Maintenance: Pap: 10/19/15, Negative with neg HR HPV MMG: 02/09/16, 3D with left breast ultrasound, Bi-Rads 2: Benign Findings, screening mammogram in one year Colonoscopy: 01/13/16, Normal, repeat in 5 years due to family history of colon cancer BMD: Never TDaP: 05/05/13 Hep C and HIV: 10/19/15 Labs: done today  Urine: 2+ RBC, trace WBC    reports that she has never smoked. She has never used smokeless tobacco. She reports that she drinks alcohol. She reports that she does not use drugs.  Past Medical History:  Diagnosis Date  . HLD (hyperlipidemia)   . STD (sexually transmitted disease)    HSV  . Urinary incontinence     Past Surgical History:  Procedure Laterality Date  . CESAREAN SECTION    . [place] ABLATION  12.2.11   in office, v-vagal response    Current Outpatient Prescriptions  Medication Sig Dispense Refill  .  Ascorbic Acid (VITAMIN C PO) Take by mouth.    . Biotin 5000 MCG CAPS Take 1 capsule by mouth daily.    . Calcium-Vitamin A-Vitamin D (LIQUID CALCIUM PO) Take 1,000 mg by mouth daily.    . Ginkgo Biloba 100 MG CAPS Take 1 capsule by mouth daily.    . nitrofurantoin, macrocrystal-monohydrate, (MACROBID) 100 MG capsule 1 cap daily prn 30 capsule 5  . Omega-3 Fatty Acids (FISH OIL) 1000 MG CAPS Take 1 capsule by mouth daily.     No current facility-administered medications for this visit.     Family History  Problem Relation Age of Onset  . Hyperlipidemia Mother   . Thyroid disease Mother   . Osteoporosis Mother   . Hyperlipidemia Father   . Heart attack Father   . Heart disease Father   . Transient ischemic attack Father   . Prostate cancer Father     dx. 74-71  . Melanoma Father     dx. >58  . Colon cancer Sister     dx. 58-59  . Breast cancer Maternal Grandmother     dx. 24 or younger  . Heart disease Paternal Grandmother   . Heart disease Paternal Grandfather   . Breast cancer Sister 39    mastectomy  . Brain cancer Maternal Uncle     unspecified type  . Cirrhosis  Maternal Uncle     +EtOH  . Heart Problems Paternal Uncle   . Heart Problems Maternal Grandfather   . Testicular cancer Maternal Uncle     dx. early 1s  . Lung cancer Cousin     d. late 63s; smoker  . Testicular cancer Cousin 75  . Melanoma Cousin 75  . Sudden death Neg Hx   . Hypertension Neg Hx   . Diabetes Neg Hx     ROS:  Pertinent items are noted in HPI.  Otherwise, a comprehensive ROS was negative.  Exam:   BP 116/70 (BP Location: Right Arm, Patient Position: Sitting, Cuff Size: Normal)   Pulse 80   Resp 16   Ht 5' 3.75" (1.619 m)   Wt 112 lb (50.8 kg)   LMP 10/05/2010 (Approximate)   BMI 19.38 kg/m  Height: 5' 3.75" (161.9 cm) Ht Readings from Last 3 Encounters:  11/07/16 5' 3.75" (1.619 m)  01/13/16 5\' 3"  (1.6 m)  12/30/15 5' 3.5" (1.613 m)    General appearance: alert, cooperative  and appears stated age Head: Normocephalic, without obvious abnormality, atraumatic Neck: no adenopathy, supple, symmetrical, trachea midline and thyroid normal to inspection and palpation Lungs: clear to auscultation bilaterally Breasts: positive findings: fibrocystic changes and 3 cm, smooth, round, firm, mobile and tender nodule located on the left lower inner quadrant.  Both breast are very fibrocystic with multiple other cystic character. Heart: regular rate and rhythm Abdomen: soft, non-tender; no masses,  no organomegaly, no true flank pain Extremities: extremities normal, atraumatic, no cyanosis or edema Skin: Skin color, texture, turgor normal. No rashes or lesions Lymph nodes: Cervical, supraclavicular, and axillary nodes normal. No abnormal inguinal nodes palpated Neurologic: Grossly normal   Pelvic: External genitalia:  no lesions              Urethra:  normal appearing urethra with no masses, tenderness or lesions              Bartholin's and Skene's: normal                 Vagina: normal appearing vagina with normal color and discharge, no lesions              Cervix: anteverted              Pap taken: No. 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  They will do diagnostic left MMG and [place]  Refill on Macrobid to use PC  Will follow with labs and urine culture  Counseled on breast self exam, mammography screening, adequate intake of calcium and vitamin D, diet and exercise return annually or prn  An After Visit Summary was printed and given to the patient.

## 2016-11-07 NOTE — Patient Instructions (Signed)

## 2016-11-07 NOTE — Progress Notes (Signed)
Spoke with Amanda Evans at [facility]. Patient scheduled while in office for Diagnostic left breast MMG, left breast ultrasound and left breast aspiration. Patient scheduled for 11/08/16 at 1pm, arriving at 12:40pm. Patient is agreeable to date and time.

## 2016-11-08 ENCOUNTER — Ambulatory Visit
Admission: RE | Admit: 2016-11-08 | Discharge: 2016-11-08 | Disposition: A | Discharge status: Home / Self Care | Payer: 59 | Source: Ambulatory Visit | Attending: Nurse Practitioner | Admitting: Nurse Practitioner

## 2016-11-08 DIAGNOSIS — N6320 Unspecified lump in the left breast, unspecified quadrant: Secondary | ICD-10-CM

## 2016-11-08 DIAGNOSIS — N6323 Unspecified lump in the left breast, lower outer quadrant: Secondary | ICD-10-CM | POA: Diagnosis not present

## 2016-11-08 DIAGNOSIS — N6012 Diffuse cystic mastopathy of left breast: Secondary | ICD-10-CM | POA: Diagnosis not present

## 2016-11-08 LAB — URINALYSIS, MICROSCOPIC ONLY
CRYSTALS: NONE SEEN [HPF]
Casts: NONE SEEN [LPF]
WBC UA: NONE SEEN WBC/HPF (ref <=5)
Yeast: NONE SEEN [HPF]

## 2016-11-08 LAB — VITAMIN D 25 HYDROXY (VIT D DEFICIENCY, FRACTURES): Vit D, 25-Hydroxy: 43 ng/mL (ref 30–100)

## 2016-11-08 LAB — URINE CULTURE: Organism ID, Bacteria: NO GROWTH

## 2016-11-11 NOTE — Progress Notes (Signed)
Encounter reviewed by Dr. Brook Amundson C. Silva.  

## 2016-11-12 ENCOUNTER — Ambulatory Visit
Admission: RE | Admit: 2016-11-12 | Discharge: 2016-11-12 | Disposition: A | Discharge status: Home / Self Care | Payer: 59 | Source: Ambulatory Visit | Attending: Nurse Practitioner | Admitting: Nurse Practitioner

## 2016-11-12 ENCOUNTER — Other Ambulatory Visit: Payer: Self-pay | Admitting: Nurse Practitioner

## 2016-11-12 ENCOUNTER — Inpatient Hospital Stay: Admission: RE | Admit: 2016-11-12 | Payer: 59 | Source: Ambulatory Visit

## 2016-11-12 DIAGNOSIS — N6012 Diffuse cystic mastopathy of left breast: Secondary | ICD-10-CM | POA: Diagnosis not present

## 2016-11-12 DIAGNOSIS — N6002 Solitary cyst of left breast: Secondary | ICD-10-CM

## 2016-11-12 DIAGNOSIS — N6320 Unspecified lump in the left breast, unspecified quadrant: Secondary | ICD-10-CM

## 2016-11-13 ENCOUNTER — Encounter: Payer: Self-pay | Admitting: Nurse Practitioner

## 2016-11-13 ENCOUNTER — Other Ambulatory Visit: Payer: Self-pay | Admitting: Nurse Practitioner

## 2016-11-13 MED ORDER — VALACYCLOVIR HCL 500 MG PO TABS: 500.0000 mg | ORAL_TABLET | Freq: Two times a day (BID) | ORAL | 12 refills | Status: DC

## 2016-11-13 MED FILL — VALACYCLOVIR HCL 500 MG TAB: 500 | 15 days supply | Qty: 30 | Fill #0

## 2016-12-12 ENCOUNTER — Telehealth: Payer: 59 | Admitting: Nurse Practitioner

## 2016-12-12 DIAGNOSIS — J0100 Acute maxillary sinusitis, unspecified: Secondary | ICD-10-CM | POA: Diagnosis not present

## 2016-12-12 MED ORDER — DOXYCYCLINE HYCLATE 100 MG PO TABS: 100.0000 mg | ORAL_TABLET | Freq: Two times a day (BID) | ORAL | 0 refills | Status: DC

## 2016-12-12 MED FILL — DOXYCYCLINE HYCLATE 100 MG: 100 | 10 days supply | Qty: 20 | Fill #0

## 2016-12-12 NOTE — Progress Notes (Signed)

## 2017-02-04 DIAGNOSIS — M9903 Segmental and somatic dysfunction of lumbar region: Secondary | ICD-10-CM | POA: Diagnosis not present

## 2017-02-04 DIAGNOSIS — M9904 Segmental and somatic dysfunction of sacral region: Secondary | ICD-10-CM | POA: Diagnosis not present

## 2017-02-04 DIAGNOSIS — M545 Low back pain: Secondary | ICD-10-CM | POA: Diagnosis not present

## 2017-02-04 DIAGNOSIS — M9905 Segmental and somatic dysfunction of pelvic region: Secondary | ICD-10-CM | POA: Diagnosis not present

## 2017-02-06 DIAGNOSIS — M545 Low back pain: Secondary | ICD-10-CM | POA: Diagnosis not present

## 2017-02-06 DIAGNOSIS — M9905 Segmental and somatic dysfunction of pelvic region: Secondary | ICD-10-CM | POA: Diagnosis not present

## 2017-02-06 DIAGNOSIS — M9904 Segmental and somatic dysfunction of sacral region: Secondary | ICD-10-CM | POA: Diagnosis not present

## 2017-02-06 DIAGNOSIS — M9903 Segmental and somatic dysfunction of lumbar region: Secondary | ICD-10-CM | POA: Diagnosis not present

## 2017-02-08 DIAGNOSIS — M9905 Segmental and somatic dysfunction of pelvic region: Secondary | ICD-10-CM | POA: Diagnosis not present

## 2017-02-08 DIAGNOSIS — M9903 Segmental and somatic dysfunction of lumbar region: Secondary | ICD-10-CM | POA: Diagnosis not present

## 2017-02-08 DIAGNOSIS — M9904 Segmental and somatic dysfunction of sacral region: Secondary | ICD-10-CM | POA: Diagnosis not present

## 2017-02-08 DIAGNOSIS — M545 Low back pain: Secondary | ICD-10-CM | POA: Diagnosis not present

## 2017-02-11 DIAGNOSIS — M9905 Segmental and somatic dysfunction of pelvic region: Secondary | ICD-10-CM | POA: Diagnosis not present

## 2017-02-11 DIAGNOSIS — M9904 Segmental and somatic dysfunction of sacral region: Secondary | ICD-10-CM | POA: Diagnosis not present

## 2017-02-11 DIAGNOSIS — M9903 Segmental and somatic dysfunction of lumbar region: Secondary | ICD-10-CM | POA: Diagnosis not present

## 2017-02-11 DIAGNOSIS — M545 Low back pain: Secondary | ICD-10-CM | POA: Diagnosis not present

## 2017-02-13 DIAGNOSIS — M545 Low back pain: Secondary | ICD-10-CM | POA: Diagnosis not present

## 2017-02-13 DIAGNOSIS — M9903 Segmental and somatic dysfunction of lumbar region: Secondary | ICD-10-CM | POA: Diagnosis not present

## 2017-02-13 DIAGNOSIS — M9905 Segmental and somatic dysfunction of pelvic region: Secondary | ICD-10-CM | POA: Diagnosis not present

## 2017-02-13 DIAGNOSIS — M9904 Segmental and somatic dysfunction of sacral region: Secondary | ICD-10-CM | POA: Diagnosis not present

## 2017-02-15 DIAGNOSIS — M9905 Segmental and somatic dysfunction of pelvic region: Secondary | ICD-10-CM | POA: Diagnosis not present

## 2017-02-15 DIAGNOSIS — M9903 Segmental and somatic dysfunction of lumbar region: Secondary | ICD-10-CM | POA: Diagnosis not present

## 2017-02-15 DIAGNOSIS — M9904 Segmental and somatic dysfunction of sacral region: Secondary | ICD-10-CM | POA: Diagnosis not present

## 2017-02-15 DIAGNOSIS — M545 Low back pain: Secondary | ICD-10-CM | POA: Diagnosis not present

## 2017-02-18 DIAGNOSIS — M545 Low back pain: Secondary | ICD-10-CM | POA: Diagnosis not present

## 2017-02-18 DIAGNOSIS — M9903 Segmental and somatic dysfunction of lumbar region: Secondary | ICD-10-CM | POA: Diagnosis not present

## 2017-02-18 DIAGNOSIS — M9905 Segmental and somatic dysfunction of pelvic region: Secondary | ICD-10-CM | POA: Diagnosis not present

## 2017-02-18 DIAGNOSIS — M9904 Segmental and somatic dysfunction of sacral region: Secondary | ICD-10-CM | POA: Diagnosis not present

## 2017-02-21 DIAGNOSIS — M9904 Segmental and somatic dysfunction of sacral region: Secondary | ICD-10-CM | POA: Diagnosis not present

## 2017-02-21 DIAGNOSIS — M9905 Segmental and somatic dysfunction of pelvic region: Secondary | ICD-10-CM | POA: Diagnosis not present

## 2017-02-21 DIAGNOSIS — M9903 Segmental and somatic dysfunction of lumbar region: Secondary | ICD-10-CM | POA: Diagnosis not present

## 2017-02-21 DIAGNOSIS — M545 Low back pain: Secondary | ICD-10-CM | POA: Diagnosis not present

## 2017-02-25 DIAGNOSIS — M9903 Segmental and somatic dysfunction of lumbar region: Secondary | ICD-10-CM | POA: Diagnosis not present

## 2017-02-25 DIAGNOSIS — M9905 Segmental and somatic dysfunction of pelvic region: Secondary | ICD-10-CM | POA: Diagnosis not present

## 2017-02-25 DIAGNOSIS — M545 Low back pain: Secondary | ICD-10-CM | POA: Diagnosis not present

## 2017-02-25 DIAGNOSIS — M9904 Segmental and somatic dysfunction of sacral region: Secondary | ICD-10-CM | POA: Diagnosis not present

## 2017-03-01 DIAGNOSIS — M545 Low back pain: Secondary | ICD-10-CM | POA: Diagnosis not present

## 2017-03-01 DIAGNOSIS — M9904 Segmental and somatic dysfunction of sacral region: Secondary | ICD-10-CM | POA: Diagnosis not present

## 2017-03-01 DIAGNOSIS — M9905 Segmental and somatic dysfunction of pelvic region: Secondary | ICD-10-CM | POA: Diagnosis not present

## 2017-03-01 DIAGNOSIS — M9903 Segmental and somatic dysfunction of lumbar region: Secondary | ICD-10-CM | POA: Diagnosis not present

## 2017-03-07 ENCOUNTER — Encounter: Payer: Self-pay | Admitting: Nurse Practitioner

## 2017-03-08 ENCOUNTER — Telehealth: Payer: Self-pay

## 2017-03-08 DIAGNOSIS — M545 Low back pain: Secondary | ICD-10-CM | POA: Diagnosis not present

## 2017-03-08 DIAGNOSIS — M9903 Segmental and somatic dysfunction of lumbar region: Secondary | ICD-10-CM | POA: Diagnosis not present

## 2017-03-08 DIAGNOSIS — M9905 Segmental and somatic dysfunction of pelvic region: Secondary | ICD-10-CM | POA: Diagnosis not present

## 2017-03-08 DIAGNOSIS — M9904 Segmental and somatic dysfunction of sacral region: Secondary | ICD-10-CM | POA: Diagnosis not present

## 2017-03-08 NOTE — Telephone Encounter (Signed)
Encounter opened in error

## 2017-03-21 DIAGNOSIS — H00015 Hordeolum externum left lower eyelid: Secondary | ICD-10-CM | POA: Diagnosis not present

## 2017-03-21 MED FILL — CEPHALEXIN 500 MG CAPSULE: 500 | 10 days supply | Qty: 20 | Fill #0

## 2017-04-16 DIAGNOSIS — H5213 Myopia, bilateral: Secondary | ICD-10-CM | POA: Diagnosis not present

## 2017-04-16 DIAGNOSIS — H524 Presbyopia: Secondary | ICD-10-CM | POA: Diagnosis not present

## 2017-05-27 ENCOUNTER — Ambulatory Visit (INDEPENDENT_AMBULATORY_CARE_PROVIDER_SITE_OTHER): Payer: 59 | Admitting: Sports Medicine

## 2017-05-27 ENCOUNTER — Ambulatory Visit (INDEPENDENT_AMBULATORY_CARE_PROVIDER_SITE_OTHER): Payer: 59

## 2017-05-27 ENCOUNTER — Encounter: Payer: Self-pay | Admitting: Sports Medicine

## 2017-05-27 DIAGNOSIS — M5136 Other intervertebral disc degeneration, lumbar region with discogenic back pain only: Secondary | ICD-10-CM | POA: Insufficient documentation

## 2017-05-27 DIAGNOSIS — M545 Low back pain: Secondary | ICD-10-CM

## 2017-05-27 MED ORDER — MELOXICAM 15 MG PO TABS: ORAL_TABLET | ORAL | 3 refills | Status: DC

## 2017-05-27 MED ORDER — PREDNISONE 50 MG PO TABS: ORAL_TABLET | ORAL | 0 refills | Status: DC

## 2017-05-27 MED ORDER — KETOROLAC TROMETHAMINE 30 MG/ML IJ SOLN
30.0000 mg | Freq: Once | INTRAMUSCULAR | Status: AC
  Administered 2017-05-27: 30 mg via INTRAMUSCULAR

## 2017-05-27 MED ORDER — METHYLPREDNISOLONE SODIUM SUCC 125 MG IJ SOLR
125.0000 mg | Freq: Once | INTRAMUSCULAR | Status: AC
  Administered 2017-05-27: 125 mg via INTRAMUSCULAR

## 2017-05-27 MED FILL — MELOXICAM 15 MG TABLET: 15 | 30 days supply | Qty: 30 | Fill #0

## 2017-05-27 MED FILL — predniSONE 50 MG TABS: 50 | 5 days supply | Qty: 5 | Fill #0

## 2017-05-27 NOTE — Progress Notes (Signed)
   Subjective:    I'm seeing this patient as a consultation for:  Dr. Juanita Craver  CC: Low back pain  HPI: This is a pleasant 56 year old female, for the past days she's had axial low back pain after trying to lean to the right in the kitchen. She had pain in the midline of her low back with radiation to the right but nothing overtly radicular, since then she's had pain with sitting, flexion, Valsalva. No bowel or bladder dysfunction, no saddle numbness, constitutional symptoms.  Past medical history, Surgical history, Family history not pertinant except as noted below, Social history, Allergies, and medications have been entered into the medical record, reviewed, and no changes needed.   Review of Systems: No headache, visual changes, nausea, vomiting, diarrhea, constipation, dizziness, abdominal pain, skin rash, fevers, chills, night sweats, weight loss, swollen lymph nodes, body aches, joint swelling, muscle aches, chest pain, shortness of breath, mood changes, visual or auditory hallucinations.   Objective:   General: Well Developed, well nourished, and in no acute distress.  Neuro:  Extra-ocular muscles intact, able to move all 4 extremities, sensation grossly intact.  Deep tendon reflexes tested were normal. Psych: Alert and oriented, mood congruent with affect. ENT:  Ears and nose appear unremarkable.  Hearing grossly normal. Neck: Unremarkable overall appearance, trachea midline.  No visible thyroid enlargement. Eyes: Conjunctivae and lids appear unremarkable.  Pupils equal and round. Skin: Warm and dry, no rashes noted.  Cardiovascular: Pulses palpable, no extremity edema. Back Exam:  Inspection: Unremarkable  Motion: Flexion 45 deg, Extension 45 deg, Side Bending to 45 deg bilaterally,  Rotation to 45 deg bilaterally  SLR laying: Negative  XSLR laying: Negative  Palpable tenderness: None. FABER: negative. Sensory change: Gross sensation intact to all lumbar and sacral  dermatomes.  Reflexes: 2+ at both patellar tendons, 2+ at achilles tendons, Babinski's downgoing.  Strength at foot  Plantar-flexion: 5/5 Dorsi-flexion: 5/5 Eversion: 5/5 Inversion: 5/5  Leg strength  Quad: 5/5 Hamstring: 5/5 Hip flexor: 5/5 Hip abductors: 5/5  Gait unremarkable.  Toradol 30, Solu-Medrol 125 given intramuscular.  Impression and Recommendations:   This case required medical decision making of moderate complexity.  Discogenic low back pain Axial, right worse than left. Toradol 30, Solu-Medrol 125. Prednisone, meloxicam, x-rays, home rehabilitation exercises.  Naproxen is on her allergy list but she was able to take high-dose ibuprofen without any issues. Return in one month, MRI for interventional planning if no better.

## 2017-05-27 NOTE — Assessment & Plan Note (Signed)
Axial, right worse than left. Toradol 30, Solu-Medrol 125. Prednisone, meloxicam, x-rays, home rehabilitation exercises.  Naproxen is on her allergy list but she was able to take high-dose ibuprofen without any issues. Return in one month, MRI for interventional planning if no better.

## 2017-05-30 ENCOUNTER — Telehealth: Payer: Self-pay | Admitting: Sports Medicine

## 2017-05-30 NOTE — Telephone Encounter (Signed)
Sounds good.  Was probably more gastritis than cardiac.

## 2017-05-30 NOTE — Telephone Encounter (Signed)
Pt called clinic today stating since being prescribed the Rx's for meloxicam and prednisone she experienced two episodes of chest pain. Pt states she has a large family history of heart problems, so she stopped both Rx's. Pt did get some pain relief from the injections she received in office, and she is doing the home exercises. Advised the chest pain has not occurred since stopping home Rx's.   Advised Pt to continue her home exercises and contact us if not improvement. She will need to come in for re-eval since she has not been taking Rx's as directed. Pt verbalized understanding. No further questions at this time.

## 2017-06-26 ENCOUNTER — Ambulatory Visit: Payer: 59 | Admitting: Sports Medicine

## 2017-11-14 ENCOUNTER — Telehealth: Payer: Self-pay | Admitting: Obstetrics & Gynecology

## 2017-11-14 DIAGNOSIS — Z0000 Encounter for general adult medical examination without abnormal findings: Secondary | ICD-10-CM

## 2017-11-14 NOTE — Telephone Encounter (Signed)
Future orders placed for:  CBC CMP Lipid panel TSH Vitamin D  Routing to provider for final review.  Will close encounter.

## 2017-11-14 NOTE — Telephone Encounter (Signed)
Patient scheduled for fasting labs on 2/4 before aex appointment. Will need orders. Thank you

## 2017-11-18 ENCOUNTER — Ambulatory Visit: Payer: 59 | Admitting: Nurse Practitioner

## 2017-12-10 ENCOUNTER — Other Ambulatory Visit (INDEPENDENT_AMBULATORY_CARE_PROVIDER_SITE_OTHER): Payer: 59

## 2017-12-10 DIAGNOSIS — Z0000 Encounter for general adult medical examination without abnormal findings: Secondary | ICD-10-CM | POA: Diagnosis not present

## 2017-12-11 LAB — COMPREHENSIVE METABOLIC PANEL
A/G RATIO: 1.9 (ref 1.2–2.2)
ALBUMIN: 4.5 g/dL (ref 3.5–5.5)
ALT: 13 IU/L (ref 0–32)
AST: 14 IU/L (ref 0–40)
Alkaline Phosphatase: 48 IU/L (ref 39–117)
BILIRUBIN TOTAL: 0.4 mg/dL (ref 0.0–1.2)
BUN / CREAT RATIO: 18 (ref 9–23)
BUN: 15 mg/dL (ref 6–24)
CHLORIDE: 101 mmol/L (ref 96–106)
CO2: 22 mmol/L (ref 20–29)
Calcium: 9.8 mg/dL (ref 8.7–10.2)
Creatinine, Ser: 0.85 mg/dL (ref 0.57–1.00)
GFR calc Af Amer: 90 mL/min/{1.73_m2} (ref >59)
GFR calc non Af Amer: 78 mL/min/{1.73_m2} (ref >59)
GLOBULIN, TOTAL: 2.4 g/dL (ref 1.5–4.5)
Glucose: 85 mg/dL (ref 65–99)
POTASSIUM: 5.8 mmol/L — AB (ref 3.5–5.2)
SODIUM: 141 mmol/L (ref 134–144)
Total Protein: 6.9 g/dL (ref 6.0–8.5)

## 2017-12-11 LAB — CBC
HEMOGLOBIN: 14.3 g/dL (ref 11.1–15.9)
Hematocrit: 41.3 % (ref 34.0–46.6)
MCH: 30.6 pg (ref 26.6–33.0)
MCHC: 34.6 g/dL (ref 31.5–35.7)
MCV: 88 fL (ref 79–97)
Platelets: 277 10*3/uL (ref 150–379)
RBC: 4.68 x10E6/uL (ref 3.77–5.28)
RDW: 13.3 % (ref 12.3–15.4)
WBC: 4.3 10*3/uL (ref 3.4–10.8)

## 2017-12-11 LAB — LIPID PANEL
CHOL/HDL RATIO: 3.6 ratio (ref 0.0–4.4)
Cholesterol, Total: 218 mg/dL — ABNORMAL HIGH (ref 100–199)
HDL: 61 mg/dL (ref >39)
LDL CALC: 144 mg/dL — AB (ref 0–99)
TRIGLYCERIDES: 66 mg/dL (ref 0–149)
VLDL Cholesterol Cal: 13 mg/dL (ref 5–40)

## 2017-12-11 LAB — TSH: TSH: 3.83 u[IU]/mL (ref 0.450–4.500)

## 2017-12-11 LAB — VITAMIN D 25 HYDROXY (VIT D DEFICIENCY, FRACTURES): Vit D, 25-Hydroxy: 38.9 ng/mL (ref 30.0–100.0)

## 2017-12-16 ENCOUNTER — Other Ambulatory Visit: Payer: Self-pay

## 2017-12-16 ENCOUNTER — Encounter: Payer: Self-pay | Admitting: Obstetrics & Gynecology

## 2017-12-16 ENCOUNTER — Ambulatory Visit (INDEPENDENT_AMBULATORY_CARE_PROVIDER_SITE_OTHER): Payer: 59 | Admitting: Obstetrics & Gynecology

## 2017-12-16 VITALS — BP 106/74 | HR 72 | Resp 14 | Ht 63.5 in | Wt 113.2 lb

## 2017-12-16 DIAGNOSIS — Z01419 Encounter for gynecological examination (general) (routine) without abnormal findings: Secondary | ICD-10-CM

## 2017-12-16 DIAGNOSIS — E875 Hyperkalemia: Secondary | ICD-10-CM | POA: Diagnosis not present

## 2017-12-16 MED ORDER — NITROFURANTOIN MONOHYD MACRO 100 MG PO CAPS: ORAL_CAPSULE | ORAL | 2 refills | Status: DC

## 2017-12-16 NOTE — Progress Notes (Signed)
57 y.o. G64P1011 Married Caucasian F here for annual exam.  Doing well.  Denies vaginal bleeding.   Concerned about redness on cheeks.  Seems to be new the past year.  No acne.  Advised looks like rosacea.  Needs dermatologist names.    Also reports concerns about cardiovascular disease.  Lab work done last week reviewed with pt.  Cholesterol is mildly elevated with mildly elevated LDLs.  Good HDLs and triglycerides.  Does not have chest pain, pressure, or atypical SOB with exercise.     Has been having some back pain issues.  Dr. Dianah Field in July of last year.  Did not need PT.  Does stretching every morning.  Back pain is much better.  Denies vaginal bleeding.    No LMP recorded. Patient has had an ablation.          Sexually active: Yes.    The current method of family planning is ablation.    Exercising: Yes.    high impact, weight lifting  Smoker:  no  Health Maintenance: Pap:  10/19/15 negative, HR HPV negative  History of abnormal Pap:  yes MMG:  11/08/16 BIRADS 2 benign  Colonoscopy:  01/13/16 normal, repeat 5 years due to family history of colon cancer  BMD:   never TDaP:  05/05/13  Pneumonia vaccine(s):  no Shingrix:   no Hep C testing: 10/19/15 negative  Screening Labs: done on 12/10/17 , Hb today: 14.3 on 12/10/17, Urine today: not collected   reports that  has never smoked. she has never used smokeless tobacco. She reports that she drinks alcohol. She reports that she does not use drugs.  Past Medical History:  Diagnosis Date  . HLD (hyperlipidemia)   . STD (sexually transmitted disease)    HSV  . Urinary incontinence     Past Surgical History:  Procedure Laterality Date  . CESAREAN SECTION    . [place] ABLATION  12.2.11   in office, v-vagal response    Current Outpatient Medications  Medication Sig Dispense Refill  . Ascorbic Acid (VITAMIN C PO) Take by mouth.    . Biotin 5000 MCG CAPS Take 1 capsule by mouth daily.    . Calcium-Vitamin A-Vitamin D (LIQUID  CALCIUM PO) Take 1,000 mg by mouth daily.    . Ginkgo Biloba 100 MG CAPS Take 1 capsule by mouth daily.    . meloxicam (MOBIC) 15 MG tablet One tab PO qAM with breakfast for 2 weeks, then daily prn pain. 30 tablet 3  . nitrofurantoin, macrocrystal-monohydrate, (MACROBID) 100 MG capsule 1 cap daily prn 30 capsule 5  . Omega-3 Fatty Acids (FISH OIL) 1000 MG CAPS Take 1 capsule by mouth daily.    . TURMERIC PO Take by mouth daily.    . valACYclovir (VALTREX) 500 MG tablet Take 1 tablet (500 mg total) by mouth 2 (two) times daily. (Patient taking differently: Take 500 mg by mouth as needed. ) 30 tablet 12   No current facility-administered medications for this visit.     Family History  Problem Relation Age of Onset  . Hyperlipidemia Mother   . Thyroid disease Mother   . Osteoporosis Mother   . Hyperlipidemia Father   . Heart attack Father   . Heart disease Father   . Transient ischemic attack Father   . Prostate cancer Father        dx. 76-71  . Melanoma Father        dx. >56  . Colon cancer Sister  dx. 58-59  . Breast cancer Maternal Grandmother        dx. 68 or younger  . Heart disease Paternal Grandmother   . Heart disease Paternal Grandfather   . Breast cancer Sister 51       mastectomy  . Brain cancer Maternal Uncle        unspecified type  . Cirrhosis Maternal Uncle        +EtOH  . Heart Problems Paternal Uncle   . Heart Problems Maternal Grandfather   . Testicular cancer Maternal Uncle        dx. early 27s  . Lung cancer Cousin        d. late 67s; smoker  . Testicular cancer Cousin 55  . Melanoma Cousin 24  . Sudden death Neg Hx   . Hypertension Neg Hx   . Diabetes Neg Hx     ROS:  Pertinent items are noted in HPI.  Otherwise, a comprehensive ROS was negative.  Exam:   BP 106/74 (BP Location: Right Arm, Patient Position: Sitting, Cuff Size: Normal)   Pulse 72   Resp 14   Ht 5' 3.5" (1.613 m)   Wt 113 lb 4 oz (51.4 kg)   BMI 19.75 kg/m   Weight  change: +1#   Height: 5' 3.5" (161.3 cm)  Ht Readings from Last 3 Encounters:  12/16/17 5' 3.5" (1.613 m)  11/07/16 5' 3.75" (1.619 m)  01/13/16 5\' 3"  (1.6 m)   General appearance: alert, cooperative and appears stated age Head: Normocephalic, without obvious abnormality, atraumatic Neck: no adenopathy, supple, symmetrical, trachea midline and thyroid normal to inspection and palpation Lungs: clear to auscultation bilaterally Breasts: normal appearance, no masses or tenderness Heart: regular rate and rhythm Abdomen: soft, non-tender; bowel sounds normal; no masses,  no organomegaly Extremities: extremities normal, atraumatic, no cyanosis or edema Skin: Skin color, texture, turgor normal. No rashes or lesions Lymph nodes: Cervical, supraclavicular, and axillary nodes normal. No abnormal inguinal nodes palpated Neurologic: Grossly normal   Pelvic: External genitalia:  no lesions              Urethra:  normal appearing urethra with no masses, tenderness or lesions              Bartholins and Skenes: normal                 Vagina: normal appearing vagina with normal color and discharge, no lesions              Cervix: no lesions              Pap taken: No. Bimanual Exam:  Uterus:  normal size, contour, position, consistency, mobility, non-tender              Adnexa: normal adnexa and no mass, fullness, tenderness               Rectovaginal: Confirms               Anus:  normal sphincter tone, no lesions  Chaperone was present for exam.  A:  Well Woman with normal exam H/o novasure endometrial ablation 12/11 H/o Grade D breast density with recurrent breast cysts H/O HSV Family hx of colon cancer age 63 (deceased), 1/2 sister with breast cancer, MGM with breast cancer.  Pt did have appt with genetics testing.  Declined having testing done after seeing genetics.   H/o recurrent UTIs Elevated potassium on lab work last week   P:  Mammogram guidelines reviewed.  Doing yearly 3D  MMG pap smear and HR HPV will be obtained at next visit.  Both were negative 12/16. Repeat potassium level today Names for dermatologist given Rx for macrobid 100mg  po post coitally and bid x 3 days if has UTI symptoms.  #30/2RF. return annually or prn

## 2017-12-16 NOTE — Patient Instructions (Signed)
[facility] [address], [address], [place], [place] [address]  Phone: [phone]

## 2017-12-17 LAB — POTASSIUM: Potassium: 4.4 mmol/L (ref 3.5–5.2)

## 2018-04-15 ENCOUNTER — Other Ambulatory Visit: Payer: Self-pay | Admitting: Obstetrics & Gynecology

## 2018-04-15 DIAGNOSIS — Z1231 Encounter for screening mammogram for malignant neoplasm of breast: Secondary | ICD-10-CM

## 2018-04-30 DIAGNOSIS — M7662 Achilles tendinitis, left leg: Secondary | ICD-10-CM | POA: Diagnosis not present

## 2018-04-30 DIAGNOSIS — R6884 Jaw pain: Secondary | ICD-10-CM | POA: Diagnosis not present

## 2018-05-02 ENCOUNTER — Ambulatory Visit
Admission: RE | Admit: 2018-05-02 | Discharge: 2018-05-02 | Disposition: A | Discharge status: Home / Self Care | Payer: 59 | Source: Ambulatory Visit | Attending: Obstetrics & Gynecology | Admitting: Obstetrics & Gynecology

## 2018-05-02 DIAGNOSIS — Z1231 Encounter for screening mammogram for malignant neoplasm of breast: Secondary | ICD-10-CM | POA: Diagnosis not present

## 2018-05-07 DIAGNOSIS — H524 Presbyopia: Secondary | ICD-10-CM | POA: Diagnosis not present

## 2018-05-07 DIAGNOSIS — H5213 Myopia, bilateral: Secondary | ICD-10-CM | POA: Diagnosis not present

## 2018-05-10 ENCOUNTER — Ambulatory Visit (INDEPENDENT_AMBULATORY_CARE_PROVIDER_SITE_OTHER): Payer: Self-pay | Admitting: Nurse Practitioner

## 2018-05-10 VITALS — BP 105/80 | HR 72 | Temp 98.2°F | Resp 16 | Wt 112.8 lb

## 2018-05-10 DIAGNOSIS — R21 Rash and other nonspecific skin eruption: Secondary | ICD-10-CM

## 2018-05-10 MED ORDER — CLOBETASOL PROPIONATE 0.05 % EX CREA: 1.0000 "application " | TOPICAL_CREAM | Freq: Two times a day (BID) | CUTANEOUS | 0 refills | Status: AC

## 2018-05-10 NOTE — Patient Instructions (Signed)
Rash A rash is a change in the color of the skin. A rash can also change the way your skin feels. There are many different conditions and factors that can cause a rash. Follow these instructions at home: Pay attention to any changes in your symptoms. Follow these instructions to help with your condition: Medicine Take or apply over-the-counter and prescription medicines only as told by your health care provider. These may include:  Corticosteroid cream.  Anti-itch lotions.  Oral antihistamines.  Skin Care  Apply cool compresses to the affected areas.  Try taking a bath with: ? Epsom salts. Follow the instructions on the packaging. You can get these at your local pharmacy or grocery store. ? Baking soda. Pour a small amount into the bath as told by your health care provider. ? Colloidal oatmeal. Follow the instructions on the packaging. You can get this at your local pharmacy or grocery store.  Try applying baking soda paste to your skin. Stir water into baking soda until it reaches a paste-like consistency.  Do not scratch or rub your skin.  Avoid covering the rash. Make sure the rash is exposed to air as much as possible. General instructions  Avoid hot showers or baths, which can make itching worse. A cold shower may help.  Avoid scented soaps, detergents, and perfumes. Use gentle soaps, detergents, perfumes, and other cosmetic products.  Avoid any substance that causes your rash. Keep a journal to help track what causes your rash. Write down: ? What you eat. ? What cosmetic products you use. ? What you drink. ? What you wear. This includes jewelry.  Keep all follow-up visits as told by your health care provider. This is important. Contact a health care provider if:  You sweat at night.  You lose weight.  You urinate more than normal.  You feel weak.  You vomit.  Your skin or the whites of your eyes look yellow (jaundice).  Your skin: ? Tingles. ? Is  numb.  Your rash: ? Does not go away after several days. ? Gets worse.  You are: ? Unusually thirsty. ? More tired than normal.  You have: ? New symptoms. ? Pain in your abdomen. ? A fever. ? Diarrhea. Get help right away if:  You develop a rash that covers all or most of your body. The rash may or may not be painful.  You develop blisters that: ? Are on top of the rash. ? Grow larger or grow together. ? Are painful. ? Are inside your nose or mouth.  You develop a rash that: ? Looks like purple pinprick-sized spots all over your body. ? Has a "bull's eye" or looks like a target. ? Is not related to sun exposure, is red and painful, and causes your skin to peel. This information is not intended to replace advice given to you by your health care provider. Make sure you discuss any questions you have with your health care provider. Document Released: 10/12/2002 Document Revised: 03/27/2016 Document Reviewed: 03/09/2015 Elsevier Interactive Patient Education  2018 Elsevier Inc.  

## 2018-05-10 NOTE — Progress Notes (Signed)
Subjective:     Amanda Evans is a 57 y.o. female who presents for evaluation of a rash involving the right antecubital region. Rash started 2 days ago. Lesions are red, and blistering and raised in texture. Rash has changed over time. Rash is painful.  Rash is draining yellowish discharge.  Associated symptoms: none. Patient denies: abdominal pain, fever, irritability, nausea, sore throat and vomiting. Patient has not had contacts with similar rash. Patient has not had new exposures (soaps, lotions, laundry detergents, foods, medications, plants, insects or animals).  The patient states she was out in her garage about 2 days ago cleaning out the light of dust and she suspects weeds as well.  The patient states she used a clear calamine lotion to the area.  Patient states that the area does look better today.  The following portions of the patient's history were reviewed and updated as appropriate: allergies, current medications and past medical history.  Review of Systems Constitutional: negative Ears, nose, mouth, throat, and face: negative Respiratory: negative Cardiovascular: negative Integument/breast: positive for rash and skin color change, negative for dryness    Objective:    BP 105/80 (BP Location: Left Arm, Patient Position: Sitting, Cuff Size: Normal)   Pulse 72   Temp 98.2 F (36.8 C) (Oral)   Resp 16   Wt 112 lb 12.8 oz (51.2 kg)   SpO2 98%   BMI 19.67 kg/m  General:  alert, cooperative and no distress  Skin:  vesicles noted on right antecubital, crusted yellowish drainage, erythematous base. Rash approximately 3cm x 3cm     Assessment:  Rash/Nonspecific skin Eruption   Plan:   Exam findings, diagnosis etiology and medication use and indications reviewed with patient. Follow- Up and discharge instructions provided. No emergent/urgent issues found on exam.  Patient verbalized understanding of information provided and agrees with plan of care (POC), all questions  answered.  1. Rash - Temovate 0.05%, apply to the affected areas twice daily for 7 days or until symptoms improve. Meds ordered this encounter  Medications  . clobetasol cream (TEMOVATE) 0.05 %    Sig: Apply 1 application topically 2 (two) times daily for 7 days. Apply to the affected area twice daily or until symptoms improve.    Dispense:  30 g    Refill:  0    Order Specific Question:   Supervising Provider    Answer:   Ricard Dillon [phone]

## 2018-05-11 ENCOUNTER — Telehealth: Payer: Self-pay | Admitting: Nurse Practitioner

## 2018-05-11 NOTE — Telephone Encounter (Signed)
Patient called to let me know how the rash on her arm was doing today.  Patient states she still has redness, but the drainage and pain has subsided.  Informed patient to continue with current treatment regimen.  Patient states she may stop by in 2 days to let me take a look at it.  Told her that would be fine.

## 2018-05-14 DIAGNOSIS — R6884 Jaw pain: Secondary | ICD-10-CM | POA: Diagnosis not present

## 2018-05-14 DIAGNOSIS — M7662 Achilles tendinitis, left leg: Secondary | ICD-10-CM | POA: Diagnosis not present

## 2018-05-20 DIAGNOSIS — M7662 Achilles tendinitis, left leg: Secondary | ICD-10-CM | POA: Diagnosis not present

## 2018-05-20 DIAGNOSIS — R6884 Jaw pain: Secondary | ICD-10-CM | POA: Diagnosis not present

## 2018-05-28 DIAGNOSIS — R6884 Jaw pain: Secondary | ICD-10-CM | POA: Diagnosis not present

## 2018-05-28 DIAGNOSIS — M7662 Achilles tendinitis, left leg: Secondary | ICD-10-CM | POA: Diagnosis not present

## 2018-05-28 MED FILL — NITROFURANTOIN MONO-MCR 100: 100 | 27 days supply | Qty: 30 | Fill #0

## 2018-05-29 ENCOUNTER — Other Ambulatory Visit: Payer: Self-pay | Admitting: Obstetrics & Gynecology

## 2018-05-29 ENCOUNTER — Encounter: Payer: Self-pay | Admitting: Obstetrics & Gynecology

## 2018-06-04 DIAGNOSIS — R6884 Jaw pain: Secondary | ICD-10-CM | POA: Diagnosis not present

## 2018-06-04 DIAGNOSIS — M7662 Achilles tendinitis, left leg: Secondary | ICD-10-CM | POA: Diagnosis not present

## 2018-06-11 DIAGNOSIS — R6884 Jaw pain: Secondary | ICD-10-CM | POA: Diagnosis not present

## 2018-06-11 DIAGNOSIS — M7662 Achilles tendinitis, left leg: Secondary | ICD-10-CM | POA: Diagnosis not present

## 2018-06-18 DIAGNOSIS — R6884 Jaw pain: Secondary | ICD-10-CM | POA: Diagnosis not present

## 2018-06-18 DIAGNOSIS — M7662 Achilles tendinitis, left leg: Secondary | ICD-10-CM | POA: Diagnosis not present

## 2018-06-26 DIAGNOSIS — R6884 Jaw pain: Secondary | ICD-10-CM | POA: Diagnosis not present

## 2018-06-26 DIAGNOSIS — M7662 Achilles tendinitis, left leg: Secondary | ICD-10-CM | POA: Diagnosis not present

## 2018-07-08 DIAGNOSIS — M7662 Achilles tendinitis, left leg: Secondary | ICD-10-CM | POA: Diagnosis not present

## 2018-07-08 DIAGNOSIS — R6884 Jaw pain: Secondary | ICD-10-CM | POA: Diagnosis not present

## 2018-07-14 DIAGNOSIS — M7662 Achilles tendinitis, left leg: Secondary | ICD-10-CM | POA: Diagnosis not present

## 2018-07-14 DIAGNOSIS — R6884 Jaw pain: Secondary | ICD-10-CM | POA: Diagnosis not present

## 2018-07-15 ENCOUNTER — Telehealth: Payer: Self-pay | Admitting: Obstetrics & Gynecology

## 2018-07-15 NOTE — Telephone Encounter (Signed)
Spoke with patient. Request OV to discuss HRT. Reports hot flashes, insomnia and painful intercourse. Symptoms started in 06/2018.   OV scheduled for 9/23 at 2:15pm with Dr. Sabra Heck.   Routing to provider for final review. Patient is agreeable to disposition. Will close encounter.

## 2018-07-15 NOTE — Telephone Encounter (Signed)
Patient is experiencing menopausal symptoms she would like to discuss with Dr Sabra Heck.

## 2018-07-25 DIAGNOSIS — M7662 Achilles tendinitis, left leg: Secondary | ICD-10-CM | POA: Diagnosis not present

## 2018-07-25 DIAGNOSIS — R6884 Jaw pain: Secondary | ICD-10-CM | POA: Diagnosis not present

## 2018-07-28 ENCOUNTER — Encounter: Payer: Self-pay | Admitting: Obstetrics & Gynecology

## 2018-07-28 ENCOUNTER — Ambulatory Visit: Payer: 59 | Admitting: Obstetrics & Gynecology

## 2018-07-28 VITALS — BP 110/70 | HR 88 | Resp 16 | Ht 63.5 in | Wt 113.2 lb

## 2018-07-28 DIAGNOSIS — N951 Menopausal and female climacteric states: Secondary | ICD-10-CM

## 2018-07-28 MED ORDER — ESTRADIOL 0.1 MG/GM VA CREA: TOPICAL_CREAM | VAGINAL | 2 refills | Status: DC

## 2018-07-28 MED ORDER — GABAPENTIN 100 MG PO CAPS: ORAL_CAPSULE | ORAL | 0 refills | Status: DC

## 2018-07-28 MED FILL — GABAPENTIN 100 MG CAPSULE: 100 | 30 days supply | Qty: 90 | Fill #0

## 2018-07-28 MED FILL — ESTRADIOL 0.1 MG/GM CREA: 0.1 | 90 days supply | Qty: 43 | Fill #0

## 2018-07-28 NOTE — Progress Notes (Signed)
GYNECOLOGY  VISIT  CC:   Hot flashes, night sweats, sleep issues  HPI: 57 y.o. G61P1011 Married White or Caucasian female here for discussion of menopausal symptoms that have been present for about a month.  She is having hot flashes and significant night sweats.  Her hot flashes seem worse at night.  She will throw off the covers when she is hot and then in a short while will be so cold that she sometime shivers.  She has been sleeping in a separate bed due to her husband's snoring and now this has really affected her sleep.  She's getting no more than four hours of sleep.    She is also reporting painful intercourse and what feels like dryness and tightness with intercourse.  She does not feel dry all of the time.  Reports she used to wear a mini pad all of the time due to vaginal moisture/discharge but this has stopped.    She did start Estroven about 18 days and this has helped some but it isn't really helping her sleep.  Treatment options reviewed with pt.  HRT and non hormonal options including gabapentin and SSRIs as well as sleep medications reviewed.  Specifically in regards to HRT, WHI study results including but not limited to risks of increased risks of heart disease, MI, stroke, DVT, and breast cancer were discussed.  Benefits of improved quality of life, improved bone density and decreased risks of colon cancer also discussed.  She would really not like to be on estrogen at this time if possible.  Will start with Gabapentin.  Dosing and side effects reviewed.    Pt does not bleed due to hx of endometrial ablation.   GYNECOLOGIC HISTORY: Patient's last menstrual period was 10/05/2010 (approximate). Contraception: post menopausal  Menopausal hormone therapy: none  Patient Active Problem List   Diagnosis Date Noted  . 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 Family history of breast cancer in female 12/15/2015  . Family history of colon cancer 12/15/2015  . Bilateral breast cysts  06/23/2014  . Left Achilles tendinitis 04/22/2012  . 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 NECK PAIN 08/17/2010  . HIP PAIN, LEFT 10/19/2009  . ILIOTIBIAL BAND SYNDROME 08/24/2009    Past Medical History:  Diagnosis Date  . HLD (hyperlipidemia)   . STD (sexually transmitted disease)    HSV  . Urinary incontinence     Past Surgical History:  Procedure Laterality Date  . CESAREAN SECTION    . [place] ABLATION  12.2.11   in office, v-vagal response    MEDS:   Current Outpatient Medications on File Prior to Visit  Medication Sig Dispense Refill  . Ascorbic Acid (VITAMIN C PO) Take by mouth.    . Biotin 5000 MCG CAPS Take 1 capsule by mouth daily.    . Calcium-Vitamin A-Vitamin D (LIQUID CALCIUM PO) Take 1,000 mg by mouth daily.    . Ginkgo Biloba 100 MG CAPS Take 1 capsule by mouth daily.    . Omega-3 Fatty Acids (FISH OIL) 1000 MG CAPS Take 1 capsule by mouth daily.    . TURMERIC PO Take by mouth daily.    . valACYclovir (VALTREX) 500 MG tablet Take 1 tablet (500 mg total) by mouth 2 (two) times daily. (Patient not taking: Reported on 05/10/2018) 30 tablet 12   No current facility-administered medications on file prior to visit.     ALLERGIES: Naproxen sodium and Penicillins  Family History  Problem Relation Age of  Onset  . Hyperlipidemia Mother   . Thyroid disease Mother   . Osteoporosis Mother   . Hyperlipidemia Father   . Heart attack Father   . Heart disease Father   . Transient ischemic attack Father   . Prostate cancer Father        dx. 53-71  . Melanoma Father        dx. >52  . Colon cancer Sister        dx. 58-59  . Breast cancer Maternal Grandmother        dx. 72 or younger  . Heart disease Paternal Grandmother   . Heart disease Paternal Grandfather   . Breast cancer Sister 60       mastectomy  . Brain cancer Maternal Uncle        unspecified type  . Cirrhosis Maternal Uncle        +EtOH  . Heart Problems Paternal Uncle   . Heart  Problems Maternal Grandfather   . Testicular cancer Maternal Uncle        dx. early 28s  . Lung cancer Cousin        d. late 76s; smoker  . Testicular cancer Cousin 76  . Melanoma Cousin 35  . Sudden death Neg Hx   . Hypertension Neg Hx   . Diabetes Neg Hx     SH:  Married, non smoker  Review of Systems  Genitourinary: Positive for dyspareunia.       Loss of sexual interest   Musculoskeletal: Positive for arthralgias.  Psychiatric/Behavioral:       Difficulty with memory   All other systems reviewed and are negative.   PHYSICAL EXAMINATION:    BP 110/70 (BP Location: Right Arm, Patient Position: Sitting, Cuff Size: Normal)   Pulse 88   Resp 16   Ht 5' 3.5" (1.613 m)   Wt 113 lb 3.2 oz (51.3 kg)   LMP 10/05/2010 (Approximate)   BMI 19.74 kg/m     General appearance: alert, cooperative and appears stated age Lymph:  no inguinal LAD noted  Pelvic: External genitalia:  no lesions              Urethra:  normal appearing urethra with no masses, tenderness or lesions              Bartholins and Skenes: normal                 Vagina: mild atrophic changes noted, no bleeding or lesions noted              Chaperone was present for exam.  Assessment: Vasomotor symptoms c/w menopause  Dyspareunia likely due to estrogen changes Amenorrhea due to hx of endometrial ablation  Plan: Estrace 1gm pv 2-3 times weekly.   Gabapentin 100mg  nightly x 5 nights, then can increase to 200mg  nightly x 5 nights, then can increase to 300mg  nightly.  Pt knows to call with any side effects/concerns FSH will be obtained today to confirm menopausal status.   Recheck 1 month   ~25 minutes spent with patient >50% of time was in face to face discussion of above.

## 2018-07-29 LAB — FOLLICLE STIMULATING HORMONE: FSH: 154.6 m[IU]/mL

## 2018-08-28 ENCOUNTER — Telehealth: Payer: Self-pay | Admitting: Obstetrics & Gynecology

## 2018-08-28 ENCOUNTER — Ambulatory Visit: Payer: 59 | Admitting: Obstetrics & Gynecology

## 2018-08-28 NOTE — Telephone Encounter (Signed)
Patient cannot make it to today's appointment; her father is in the hospital.

## 2018-09-08 ENCOUNTER — Other Ambulatory Visit: Payer: Self-pay | Admitting: Obstetrics & Gynecology

## 2018-09-08 NOTE — Telephone Encounter (Signed)
Medication refill request: gabapentin 100mg  Last AEX:  12/16/17 SM Next AEX: 03/05/19 SM Last MMG (if hormonal medication request): 05/02/18 [id number]:Neg  Refill authorized: 07/28/18 #90/0R. Today pleas advise.   Called patient. She is taking 300mg  nightly.  Follow up appt scheduled 09/22/18.   Patient states she only has pills for tonight. Would like to pick up Rx tomorrow.   Please advise.

## 2018-09-09 ENCOUNTER — Telehealth: Payer: Self-pay | Admitting: Obstetrics & Gynecology

## 2018-09-09 ENCOUNTER — Encounter: Payer: Self-pay | Admitting: Obstetrics & Gynecology

## 2018-09-09 MED ORDER — GABAPENTIN 300 MG PO CAPS
300.0000 mg | ORAL_CAPSULE | Freq: Every day | ORAL | 0 refills | Status: DC
Start: 2018-09-09 — End: 2018-09-22

## 2018-09-09 MED FILL — GABAPENTIN 300 MG CAPSULE: 300 | 90 days supply | Qty: 90 | Fill #0

## 2018-09-09 NOTE — Telephone Encounter (Signed)
Left detailed message, ok per dpr. Advised patient refill request received on 09/08/18 for gabapentin, please allow 48 hrs for refill request. Return call to office if any additional questions.   See open refill encounter dated 09/08/18.  Encounter closed.

## 2018-09-09 NOTE — Telephone Encounter (Signed)
See refill encounter dated 09/08/18.

## 2018-09-09 NOTE — Telephone Encounter (Signed)
I requested a refill on the Medication Gabba P[place] yesterday and now it's not even listed in my medications. Why?

## 2018-09-22 ENCOUNTER — Other Ambulatory Visit: Payer: Self-pay

## 2018-09-22 ENCOUNTER — Ambulatory Visit: Payer: 59 | Admitting: Obstetrics & Gynecology

## 2018-09-22 ENCOUNTER — Encounter: Payer: Self-pay | Admitting: Obstetrics & Gynecology

## 2018-09-22 VITALS — BP 118/80 | HR 88 | Resp 16 | Ht 63.5 in | Wt 117.2 lb

## 2018-09-22 DIAGNOSIS — N951 Menopausal and female climacteric states: Secondary | ICD-10-CM

## 2018-09-22 MED ORDER — GABAPENTIN 300 MG PO CAPS: 300.0000 mg | ORAL_CAPSULE | Freq: Every day | ORAL | 3 refills | Status: DC

## 2018-09-22 NOTE — Progress Notes (Signed)
GYNECOLOGY  VISIT  CC:   Follow up menopausal symptoms  HPI: 57 y.o. G28P1011 Married White or Caucasian female here for follow up menopausal symptoms.  Feeling much better from hot flash standpoint.  Frequency and intensity of hot flashes is much improved as well.  Tried estroven pm but this made her very groggy in the morning.  Now taking 1/2 tablet of the Estroven pm.  Using the Gabapentin 300mg  nightly.  Sleep is improved.    Had intercourse once since last visit.  It was better but still painful.  She did have some spotting after intercourse.    GYNECOLOGIC HISTORY: Patient's last menstrual period was 10/05/2010 (approximate). Contraception: PMP Menopausal hormone therapy: estrace vag cream   Patient Active Problem List   Diagnosis Date Noted  . 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 Family history of breast cancer in female 12/15/2015  . Family history of colon cancer 12/15/2015  . Bilateral breast cysts 06/23/2014  . Left Achilles tendinitis 04/22/2012  . 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 NECK PAIN 08/17/2010  . HIP PAIN, LEFT 10/19/2009  . ILIOTIBIAL BAND SYNDROME 08/24/2009    Past Medical History:  Diagnosis Date  . HLD (hyperlipidemia)   . STD (sexually transmitted disease)    HSV  . Urinary incontinence     Past Surgical History:  Procedure Laterality Date  . CESAREAN SECTION    . [place] ABLATION  12.2.11   in office, v-vagal response    MEDS:   Current Outpatient Medications on File Prior to Visit  Medication Sig Dispense Refill  . Ascorbic Acid (VITAMIN C PO) Take by mouth.    . Biotin 5000 MCG CAPS Take 1 capsule by mouth daily.    . Black Cohosh 40 MG CAPS Take by mouth.    . Calcium-Vitamin A-Vitamin D (LIQUID CALCIUM PO) Take 1,000 mg by mouth daily.    Marland Kitchen estradiol (ESTRACE) 0.1 MG/GM vaginal cream 1 gram vaginally twice weekly 42.5 g 2  . gabapentin (NEURONTIN) 300 MG capsule Take 1 capsule (300 mg total) by mouth at bedtime.  90 capsule 0  . Ginkgo Biloba 100 MG CAPS Take 1 capsule by mouth daily.    . Omega-3 Fatty Acids (FISH OIL) 1000 MG CAPS Take 1 capsule by mouth daily.    . TURMERIC PO Take by mouth daily.    . valACYclovir (VALTREX) 500 MG tablet Take 1 tablet (500 mg total) by mouth 2 (two) times daily. 30 tablet 12   No current facility-administered medications on file prior to visit.     ALLERGIES: Naproxen sodium and Penicillins  Family History  Problem Relation Age of Onset  . Hyperlipidemia Mother   . Thyroid disease Mother   . Osteoporosis Mother   . Hyperlipidemia Father   . Heart attack Father   . Heart disease Father   . Transient ischemic attack Father   . Prostate cancer Father        dx. 19-71  . Melanoma Father        dx. >74  . Colon cancer Sister        dx. 58-59  . Breast cancer Maternal Grandmother        dx. 71 or younger  . Heart disease Paternal Grandmother   . Heart disease Paternal Grandfather   . Breast cancer Sister 97       mastectomy  . Brain cancer Maternal Uncle        unspecified type  . Cirrhosis  Maternal Uncle        +EtOH  . Heart Problems Paternal Uncle   . Heart Problems Maternal Grandfather   . Testicular cancer Maternal Uncle        dx. early 27s  . Lung cancer Cousin        d. late 70s; smoker  . Testicular cancer Cousin 77  . Melanoma Cousin 11  . Sudden death Neg Hx   . Hypertension Neg Hx   . Diabetes Neg Hx     SH:  Married, non smoker  Review of Systems  Cardiovascular: Positive for palpitations.  Genitourinary:       Breast lumps  Loss of sexual interest  Loss of urine with sneeze or cough   All other systems reviewed and are negative.   PHYSICAL EXAMINATION:    BP 118/80 (BP Location: Right Arm, Patient Position: Sitting, Cuff Size: Normal)   Pulse 88   Resp 16   Ht 5' 3.5" (1.613 m)   Wt 117 lb 3.2 oz (53.2 kg)   LMP 10/05/2010 (Approximate)   BMI 20.44 kg/m      Chaperone was present for  exam.  Assessment: Menopausal symptoms Dyspareunia  Plan: RF for Gabapetin 300mg  nightly #90/4RF AEX scheduled for 02/2019

## 2018-10-21 MED FILL — ESTRADIOL 0.1 MG/GM CREA: 0.1 | 90 days supply | Qty: 43 | Fill #1

## 2018-12-22 ENCOUNTER — Other Ambulatory Visit (HOSPITAL_COMMUNITY)
Admission: RE | Admit: 2018-12-22 | Discharge: 2018-12-22 | Disposition: A | Discharge status: Home / Self Care | Payer: 59 | Source: Ambulatory Visit | Attending: Obstetrics & Gynecology | Admitting: Obstetrics & Gynecology

## 2018-12-22 ENCOUNTER — Encounter: Payer: Self-pay | Admitting: Obstetrics & Gynecology

## 2018-12-22 ENCOUNTER — Encounter

## 2018-12-22 ENCOUNTER — Other Ambulatory Visit: Payer: Self-pay

## 2018-12-22 ENCOUNTER — Ambulatory Visit (INDEPENDENT_AMBULATORY_CARE_PROVIDER_SITE_OTHER): Payer: 59 | Admitting: Obstetrics & Gynecology

## 2018-12-22 VITALS — BP 110/72 | HR 68 | Resp 16 | Ht 63.75 in | Wt 115.4 lb

## 2018-12-22 DIAGNOSIS — M2550 Pain in unspecified joint: Secondary | ICD-10-CM

## 2018-12-22 DIAGNOSIS — Z124 Encounter for screening for malignant neoplasm of cervix: Secondary | ICD-10-CM

## 2018-12-22 DIAGNOSIS — R6882 Decreased libido: Secondary | ICD-10-CM | POA: Diagnosis not present

## 2018-12-22 DIAGNOSIS — Z01419 Encounter for gynecological examination (general) (routine) without abnormal findings: Secondary | ICD-10-CM

## 2018-12-22 DIAGNOSIS — Z0000 Encounter for general adult medical examination without abnormal findings: Secondary | ICD-10-CM | POA: Diagnosis not present

## 2018-12-22 LAB — HM PAP SMEAR

## 2018-12-22 MED ORDER — GABAPENTIN 100 MG PO CAPS: ORAL_CAPSULE | ORAL | 0 refills | Status: DC

## 2018-12-22 NOTE — Progress Notes (Addendum)
58 y.o. G45P1011 Married White or Caucasian female here for annual exam.  Using vaginal estrogen cream and this has helped.  She is having more issues with join pains.  Having some right elbow tendonitis, she thinks but right hand/joint pain and issues with her knees.  Worse with exercise most of the time.  Mother has RA so has some concerns about this.   Pt's never had any specific testing.  Still having hot flashes.  Has not restarted the Gabapentin.  Intensity is better.  These are keeping her up at night some.   No LMP recorded. Patient has had an ablation.          Sexually active: Yes.    The current method of family planning is post menopausal status.    Exercising: Yes.    aerobics Smoker:  no  Health Maintenance: Pap:  10/19/15 neg. HR HPV:neg   09/25/13 Neg History of abnormal Pap:  yes MMG:  05/02/18 BIRADS1:Neg  Colonoscopy:  01/13/16 f/u 5 years  BMD:   Never TDaP:  2014 Pneumonia vaccine(s):  n/a Shingrix:   D/w pt Shingrix vaccination Hep C testing: 10/19/15 neg  Screening Labs: Fasting labs today    reports that she has never smoked. She has never used smokeless tobacco. She reports current alcohol use. She reports that she does not use drugs.  Past Medical History:  Diagnosis Date  . HLD (hyperlipidemia)   . STD (sexually transmitted disease)    HSV  . Urinary incontinence     Past Surgical History:  Procedure Laterality Date  . CESAREAN SECTION    . [place] ABLATION  12.2.11   in office, v-vagal response    Current Outpatient Medications  Medication Sig Dispense Refill  . Ascorbic Acid (VITAMIN C PO) Take by mouth.    . Biotin 5000 MCG CAPS Take 1 capsule by mouth daily.    . Black Cohosh 40 MG CAPS Take by mouth.    . Calcium-Vitamin A-Vitamin D (LIQUID CALCIUM PO) Take 1,000 mg by mouth daily.    Marland Kitchen estradiol (ESTRACE) 0.1 MG/GM vaginal cream 1 gram vaginally twice weekly 42.5 g 2  . Ginkgo Biloba 100 MG CAPS Take 1 capsule by mouth daily.    . Omega-3  Fatty Acids (FISH OIL) 1000 MG CAPS Take 1 capsule by mouth daily.    . TURMERIC PO Take by mouth daily.    . valACYclovir (VALTREX) 500 MG tablet Take 1 tablet (500 mg total) by mouth 2 (two) times daily. 30 tablet 12   No current facility-administered medications for this visit.     Family History  Problem Relation Age of Onset  . Hyperlipidemia Mother   . Thyroid disease Mother   . Osteoporosis Mother   . Hyperlipidemia Father   . Heart attack Father   . Heart disease Father   . Transient ischemic attack Father   . Prostate cancer Father        dx. 51-71  . Melanoma Father        dx. >79  . Colon cancer Sister        dx. 58-59  . Breast cancer Maternal Grandmother        dx. 63 or younger  . Heart disease Paternal Grandmother   . Heart disease Paternal Grandfather   . Breast cancer Sister 44       mastectomy  . Brain cancer Maternal Uncle        unspecified type  . Cirrhosis Maternal Uncle        +  EtOH  . Heart Problems Paternal Uncle   . Heart Problems Maternal Grandfather   . Testicular cancer Maternal Uncle        dx. early 63s  . Lung cancer Cousin        d. late 35s; smoker  . Testicular cancer Cousin 53  . Melanoma Cousin 56  . Sudden death Neg Hx   . Hypertension Neg Hx   . Diabetes Neg Hx     Review of Systems  Genitourinary:       Loss of sexual interest   Neurological: Positive for headaches.  All other systems reviewed and are negative.   Exam:   BP 110/72 (BP Location: Right Arm, Patient Position: Sitting, Cuff Size: Normal)   Pulse 68   Resp 16   Ht 5' 3.75" (1.619 m)   Wt 115 lb 6.4 oz (52.3 kg)   BMI 19.96 kg/m    Height: 5' 3.75" (161.9 cm)  Ht Readings from Last 3 Encounters:  12/22/18 5' 3.75" (1.619 m)  09/22/18 5' 3.5" (1.613 m)  07/28/18 5' 3.5" (1.613 m)   General appearance: alert, cooperative and appears stated age Head: Normocephalic, without obvious abnormality, atraumatic Neck: no adenopathy, supple, symmetrical, trachea  midline and thyroid normal to inspection and palpation Lungs: clear to auscultation bilaterally Breasts: normal appearance, no masses or tenderness Heart: regular rate and rhythm Abdomen: soft, non-tender; bowel sounds normal; no masses,  no organomegaly Extremities: extremities normal, atraumatic, no cyanosis or edema Skin: Skin color, texture, turgor normal. No rashes or lesions Lymph nodes: Cervical, supraclavicular, and axillary nodes normal. No abnormal inguinal nodes palpated Neurologic: Grossly normal   Pelvic: External genitalia:  no lesions              Urethra:  normal appearing urethra with no masses, tenderness or lesions              Bartholins and Skenes: normal                 Vagina: normal appearing vagina with normal color and discharge, no lesions              Cervix: no lesions              Pap taken: Yes.   Bimanual Exam:  Uterus:  normal size, contour, position, consistency, mobility, non-tender              Adnexa: normal adnexa and no mass, fullness, tenderness               Rectovaginal: Confirms               Anus:  normal sphincter tone, no lesions  Chaperone was present for exam.  A:  Well Woman with normal exam PMP, no HRT H/O novasure ablation 12/11 H/o HSV H/O Grade D breast density with recurrent breast cyst Family hx of colon cancer age 72 (decreased), 12/ sister with breast cancer, MGM with breast cancer.  (Had genetic counseling but declined testing) Remote hx of HSV New onset polyarthralgia Decreased libido Family hx of melanoma  P:   Mammogram guidelines reviewed.  Doing yearly 3D MMG pap smear with HR HPV obtained today CMP, Lipids, Vit D obtained today Testosterone obtained today Rheumatoid factor obtained today.  D/w pt possible rheumatology referral.  Will decide after blood work results are finalized. Gabapentin 100mg  nightly x 7 days, then increase to 200mg  nightly x 7 days, then 300mg .  She will let me know what  dosage she  needs Dermatology names given Return annually or prn

## 2018-12-24 LAB — CYTOLOGY - PAP
Diagnosis: UNDETERMINED — AB
HPV: NOT DETECTED

## 2018-12-25 LAB — CBC
Hematocrit: 43.8 % (ref 34.0–46.6)
Hemoglobin: 15 g/dL (ref 11.1–15.9)
MCH: 30.1 pg (ref 26.6–33.0)
MCHC: 34.2 g/dL (ref 31.5–35.7)
MCV: 88 fL (ref 79–97)
PLATELETS: 258 10*3/uL (ref 150–450)
RBC: 4.98 x10E6/uL (ref 3.77–5.28)
RDW: 12.7 % (ref 11.7–15.4)
WBC: 4.6 10*3/uL (ref 3.4–10.8)

## 2018-12-25 LAB — COMPREHENSIVE METABOLIC PANEL WITH GFR
ALT: 12 IU/L (ref 0–32)
AST: 16 IU/L (ref 0–40)
Albumin/Globulin Ratio: 2.7 — ABNORMAL HIGH (ref 1.2–2.2)
Albumin: 4.9 g/dL (ref 3.8–4.9)
Alkaline Phosphatase: 47 IU/L (ref 39–117)
BUN/Creatinine Ratio: 20 (ref 9–23)
BUN: 16 mg/dL (ref 6–24)
Bilirubin Total: 0.6 mg/dL (ref 0.0–1.2)
CO2: 24 mmol/L (ref 20–29)
Calcium: 9.5 mg/dL (ref 8.7–10.2)
Chloride: 101 mmol/L (ref 96–106)
Creatinine, Ser: 0.79 mg/dL (ref 0.57–1.00)
GFR calc Af Amer: 97 mL/min/1.73
GFR calc non Af Amer: 85 mL/min/1.73
Globulin, Total: 1.8 g/dL (ref 1.5–4.5)
Glucose: 77 mg/dL (ref 65–99)
Potassium: 4.8 mmol/L (ref 3.5–5.2)
Sodium: 140 mmol/L (ref 134–144)
Total Protein: 6.7 g/dL (ref 6.0–8.5)

## 2018-12-25 LAB — RHEUMATOID FACTOR: Rheumatoid fact SerPl-aCnc: 13.8 [IU]/mL (ref 0.0–13.9)

## 2018-12-25 LAB — TESTOSTERONE, TOTAL, LC/MS/MS: TESTOSTERONE, TOTAL: 20.5 ng/dL

## 2018-12-25 LAB — VITAMIN D 25 HYDROXY (VIT D DEFICIENCY, FRACTURES): VIT D 25 HYDROXY: 35.8 ng/mL (ref 30.0–100.0)

## 2018-12-26 ENCOUNTER — Telehealth: Payer: Self-pay | Admitting: *Deleted

## 2018-12-26 DIAGNOSIS — M2550 Pain in unspecified joint: Secondary | ICD-10-CM

## 2018-12-26 NOTE — Telephone Encounter (Signed)
-----   Message from Megan Salon, MD sent at 12/26/2018  6:52 AM EST ----- Please let pt know her CBC, CMP, and Vit D were normal.  Her testosterone level was 20 which is on the lower side of normal.  She could consider adding some topical testosterone to see if this will help libido.  Rheumatoid factor antibody was 0.1 below normal.  I'd like her to return and let me do a few more tests for her joint issues.  If ok with her, I will place additional lab test orders.  I would like to work through this issue fully before starting the testosterone, just FYI for her.  Lastly pap showed ASCUS with neg HR HPV but yeast was present on pap.  This is a normal pap smear and the yeast is likely why the ASCUS pap changes are present.  If symptomatic, ok to treat with diflucan 150mg  po x 1, repeat in 72 hours or Terazol 7 nightly x 7 nights or OTC monistat x 3 days.  02 pap recall.

## 2018-12-26 NOTE — Telephone Encounter (Signed)
LM for pt to call back. Please route to Triage if I am not available.  

## 2018-12-26 NOTE — Telephone Encounter (Signed)
Patient returning call.

## 2018-12-29 NOTE — Telephone Encounter (Signed)
Pt notified. Verbalized understanding.  She was having some itching but she is feeling better now. No rx needed.  Patient will think about testosterone treatment and let us know if she wants to start. She is willing to do additional labs for joint issues. Lab appt scheduled 2/27.   Need lab orders.   Dr. Sabra Heck

## 2019-01-01 ENCOUNTER — Other Ambulatory Visit (INDEPENDENT_AMBULATORY_CARE_PROVIDER_SITE_OTHER): Payer: 59

## 2019-01-01 DIAGNOSIS — M2550 Pain in unspecified joint: Secondary | ICD-10-CM | POA: Diagnosis not present

## 2019-01-01 NOTE — Telephone Encounter (Signed)
Lab orders have been placed

## 2019-01-02 LAB — SEDIMENTATION RATE: SED RATE: 2 mm/h (ref 0–40)

## 2019-01-02 LAB — ANA: Anti Nuclear Antibody(ANA): NEGATIVE

## 2019-01-02 LAB — C-REACTIVE PROTEIN: CRP: <1 mg/L (ref 0–10)

## 2019-01-02 LAB — CYCLIC CITRUL PEPTIDE ANTIBODY, IGG/IGA: Cyclic Citrullin Peptide Ab: 8 units (ref 0–19)

## 2019-01-03 ENCOUNTER — Encounter: Payer: Self-pay | Admitting: Obstetrics & Gynecology

## 2019-01-05 ENCOUNTER — Telehealth: Payer: Self-pay | Admitting: Obstetrics & Gynecology

## 2019-01-05 LAB — LIPID PANEL W/O CHOL/HDL RATIO
Cholesterol, Total: 220 mg/dL — ABNORMAL HIGH (ref 100–199)
HDL: 68 mg/dL (ref >39)
LDL Calculated: 134 mg/dL — ABNORMAL HIGH (ref 0–99)
Triglycerides: 90 mg/dL (ref 0–149)
VLDL CHOLESTEROL CAL: 18 mg/dL (ref 5–40)

## 2019-01-05 LAB — SPECIMEN STATUS REPORT

## 2019-01-05 NOTE — Telephone Encounter (Signed)
Lab technician Lorre Nick is checking to see if lipid testing can be added on.

## 2019-01-05 NOTE — Telephone Encounter (Signed)
Patient sent the following correspondence through Nettie. Routing to triage to assist patient with request.  After 2 blood draws within 10 days why cant I find my cholesterol levels? Am I missing it somewhere in MyChart?

## 2019-01-05 NOTE — Telephone Encounter (Signed)
Lab added on and resulted as of today.  Routed to Dr. Sabra Heck.

## 2019-01-09 ENCOUNTER — Encounter: Payer: Self-pay | Admitting: Obstetrics & Gynecology

## 2019-01-09 ENCOUNTER — Telehealth: Payer: Self-pay | Admitting: Obstetrics & Gynecology

## 2019-01-09 NOTE — Telephone Encounter (Deleted)
-----   Message from Megan Salon, MD sent at 01/08/2019  5:51 PM EST ----- Please let pt know her rheumatology blood work was normal.  She knows her rheumatoid factor was 1 point within the normal range.  Her cholesterol was mildly elevated at 220 and LDLs were 134.  This is ok to monitor.   At this point, I am happy to send her to her rheumatologist if she would like or we can keep monitoring her to see what happens with the joint pain.  Sometimes it is hard to get someone referred to rheumatology without abnormal lab work but we will absolutely try if she desires a referral.  Thanks.

## 2019-01-09 NOTE — Telephone Encounter (Signed)
Patient sent the following message through Martin. Routing to triage to assist patient with request.  Hi Dr. Sabra Heck - I have 2 questions:  1 - Where are my cholesterol results? I do not see it listed in the overall blood test results like I usually do?  2 - I saw the results of my latest blood test from 2/27 - Is there anything I should be concerned about?  I see I a have a low SED rate (still within standard range) but does that mean I'm on the verge of Congestive Heart Failure or Heart Disease?

## 2019-01-09 NOTE — Telephone Encounter (Signed)
Call to patient. She cannot speak right now. She will call back.  Cholesterol results released via epic,

## 2019-01-09 NOTE — Telephone Encounter (Signed)
-----   Message from Megan Salon, MD sent at 01/08/2019  5:51 PM EST ----- Please let pt know her rheumatology blood work was normal.  She knows her rheumatoid factor was 1 point within the normal range.  Her cholesterol was mildly elevated at 220 and LDLs were 134.  This is ok to monitor.   At this point, I am happy to send her to her rheumatologist if she would like or we can keep monitoring her to see what happens with the joint pain.  Sometimes it is hard to get someone referred to rheumatology without abnormal lab work but we will absolutely try if she desires a referral.  Thanks.

## 2019-01-19 NOTE — Telephone Encounter (Signed)
Spoke with patient.  Results given. Declines rheumatology referral at this time.  She will call back with any concerns.  Encounter closed.

## 2019-01-27 MED FILL — ESTRADIOL 0.1 MG/GM CREA: 0.1 | 90 days supply | Qty: 43 | Fill #2

## 2019-02-23 ENCOUNTER — Other Ambulatory Visit: Payer: Self-pay | Admitting: Obstetrics & Gynecology

## 2019-02-23 MED FILL — GABAPENTIN 300 MG CAPSULE: 300 | 90 days supply | Qty: 90 | Fill #0

## 2019-02-23 NOTE — Telephone Encounter (Signed)
Spoke with patient to confirm dose. Patient states that she takes 300mg  at bedtime. Per patient, pharmacy has already filled prescription. Closing encounter.

## 2019-02-23 NOTE — Telephone Encounter (Signed)
Patient is requesting a refill on Gabapentin 100MG  to be called into [facility].

## 2019-03-05 ENCOUNTER — Ambulatory Visit: Payer: 59 | Admitting: Obstetrics & Gynecology

## 2019-03-23 ENCOUNTER — Ambulatory Visit (INDEPENDENT_AMBULATORY_CARE_PROVIDER_SITE_OTHER): Payer: 59 | Admitting: Sports Medicine

## 2019-03-23 ENCOUNTER — Encounter: Payer: Self-pay | Admitting: Sports Medicine

## 2019-03-23 DIAGNOSIS — M7711 Lateral epicondylitis, right elbow: Secondary | ICD-10-CM

## 2019-03-23 MED ORDER — MELOXICAM 15 MG PO TABS: ORAL_TABLET | ORAL | 3 refills | Status: DC

## 2019-03-23 MED FILL — MELOXICAM 15 MG TABLET: 15 | 30 days supply | Qty: 30 | Fill #0

## 2019-03-23 NOTE — Assessment & Plan Note (Signed)
Meloxicam, tennis elbow brace, rehab exercises given, return in 1 month. Injection if no better.

## 2019-03-23 NOTE — Progress Notes (Signed)
Subjective:    CC: Right elbow pain  HPI: Jan is a very pleasant and active 58 year old female, for the past 4 months she has had pain that she localizes on the lateral aspect of her right elbow after an intense session of cleaning house.  She has taken some ibuprofen and had moderate relief, but it continues to hang on, it has made it difficult for her to exercise, and to do resistance training.  Pain is moderate, persistent, localized with radiation to the mid forearm and up into the mid humerus.  I reviewed the past medical history, family history, social history, surgical history, and allergies today and no changes were needed.  Please see the problem list section below in epic for further details.  Past Medical History: Past Medical History:  Diagnosis Date  . HLD (hyperlipidemia)   . STD (sexually transmitted disease)    HSV  . Urinary incontinence    Past Surgical History: Past Surgical History:  Procedure Laterality Date  . CESAREAN SECTION    . [place] ABLATION  10/06/2010   in office, had vasovagal response   Social History: Social History   Socioeconomic History  . Marital status: Married    Spouse name: Not on file  . Number of children: 1  . Years of education: Not on file  . Highest education level: Not on file  Occupational History  . Occupation: GRAPHIC Games developer: [employer]  Social Needs  . Financial resource strain: Not on file  . Food insecurity:    Worry: Not on file    Inability: Not on file  . Transportation needs:    Medical: Not on file    Non-medical: Not on file  Tobacco Use  . Smoking status: Never Smoker  . Smokeless tobacco: Never Used  Substance and Sexual Activity  . Alcohol use: Yes    Alcohol/week: 0.0 - 1.0 standard drinks  . Drug use: No  . Sexual activity: Yes    Comment: vasectomy  Lifestyle  . Physical activity:    Days per week: Not on file    Minutes per session: Not on file  . Stress: Not on file   Relationships  . Social connections:    Talks on phone: Not on file    Gets together: Not on file    Attends religious service: Not on file    Active member of club or organization: Not on file    Attends meetings of clubs or organizations: Not on file    Relationship status: Not on file  Other Topics Concern  . Not on file  Social History Narrative  . Not on file   Family History: Family History  Problem Relation Age of Onset  . Hyperlipidemia Mother   . Thyroid disease Mother   . Osteoporosis Mother   . Hyperlipidemia Father   . Heart attack Father   . Heart disease Father   . Transient ischemic attack Father   . Prostate cancer Father        dx. 56-71  . Melanoma Father        dx. >58  . Colon cancer Sister        dx. 58-59  . Breast cancer Maternal Grandmother        dx. 8 or younger  . Heart disease Paternal Grandmother   . Heart disease Paternal Grandfather   . Breast cancer Sister 4       mastectomy  . Brain cancer Maternal Uncle  unspecified type  . Cirrhosis Maternal Uncle        +EtOH  . Heart Problems Paternal Uncle   . Heart Problems Maternal Grandfather   . Testicular cancer Maternal Uncle        dx. early 48s  . Lung cancer Cousin        d. late 10s; smoker  . Testicular cancer Cousin 41  . Melanoma Cousin 56  . Sudden death Neg Hx   . Hypertension Neg Hx   . Diabetes Neg Hx    Allergies: Allergies  Allergen Reactions  . Naproxen Sodium     REACTION: hives  . Penicillins     REACTION: swelling   Medications: See med rec.  Review of Systems: No fevers, chills, night sweats, weight loss, chest pain, or shortness of breath.   Objective:    General: Well Developed, well nourished, and in no acute distress.  Neuro: Alert and oriented x3, extra-ocular muscles intact, sensation grossly intact.  HEENT: Normocephalic, atraumatic, pupils equal round reactive to light, neck supple, no masses, no lymphadenopathy, thyroid nonpalpable.   Skin: Warm and dry, no rashes. Cardiac: Regular rate and rhythm, no murmurs rubs or gallops, no lower extremity edema.  Respiratory: Clear to auscultation bilaterally. Not using accessory muscles, speaking in full sentences. Right elbow: Unremarkable to inspection. Range of motion full pronation, supination, flexion, extension. Strength is full to all of the above directions Stable to varus, valgus stress. Negative moving valgus stress test. Tender to palpation of the common extensor tendon origin with reproduction of pain with resisted extension of the wrist. Ulnar nerve does not sublux. Negative cubital tunnel Tinel's.  Impression and Recommendations:    Lateral epicondylitis, right elbow Meloxicam, tennis elbow brace, rehab exercises given, return in 1 month. Injection if no better.   ___________________________________________ Gwen Her. Dianah Field, M.D., ABFM., CAQSM. Primary Care and Sports Medicine [facility] [facility]  Adjunct Professor of [place] [employer] [facility] [employer]

## 2019-04-17 ENCOUNTER — Telehealth: Payer: Self-pay | Admitting: Obstetrics & Gynecology

## 2019-04-17 ENCOUNTER — Encounter: Payer: Self-pay | Admitting: Obstetrics & Gynecology

## 2019-04-17 NOTE — Telephone Encounter (Signed)
Spoke with patient. She is complaining of joint aches in hands--worse at night and would like referral to rheumatologist. Her mom sees [facility] and she request referral to her.  Regarding the skin issue, patient couldn't remember the name of dermatologist you had given her. Advised patient [facility]. She states she will call them for appointment.   Will discuss rheumatology referral with Dr.Miller and call her back. Routed to provider.

## 2019-04-17 NOTE — Telephone Encounter (Signed)
Patient sent the following correspondence through Elliott. Routing to triage to assist patient with request.  Hi Dr. Sabra Heck - since my last visit with you my joint pain has gotten worse and I may need to see an arthritis specialist.   Also, I have developed a small dark red place with uneven/jagged edges. Not like my other moles and skin tags. I need to have that checked to make sure it's not a skin cancer. Can you please refer me to these types of specialists?    Thank you!  Amanda Evans

## 2019-04-20 MED FILL — DOXYCYCLINE HYCLATE 100 MG: 100 | 10 days supply | Qty: 20 | Fill #0

## 2019-04-21 ENCOUNTER — Encounter: Payer: Self-pay | Admitting: Obstetrics & Gynecology

## 2019-04-21 ENCOUNTER — Encounter: Payer: Self-pay | Admitting: Sports Medicine

## 2019-04-21 ENCOUNTER — Ambulatory Visit (INDEPENDENT_AMBULATORY_CARE_PROVIDER_SITE_OTHER): Payer: 59 | Admitting: Sports Medicine

## 2019-04-21 DIAGNOSIS — M7711 Lateral epicondylitis, right elbow: Secondary | ICD-10-CM | POA: Diagnosis not present

## 2019-04-21 NOTE — Assessment & Plan Note (Signed)
Increase diligence with the rehab exercises, continue meloxicam. Return in a month for this. She does have some gelling, and pain consistent with osteoarthritis, her OB/GYN did a good work-up that was negative for autoimmune disease or rheumatoid arthritis.

## 2019-04-21 NOTE — Telephone Encounter (Signed)
This is not really a question but FYI. I had a follow up visit with Dr. Dwana Melena and he explained my blood test results regarding RA and my joint pain. His comments were that I do not have RA but rather Osteoporosis, of which there is no cure, and recommends using anti-inflammatory aids for pain and stiffness.   I no longer want to be referred to a RA specialist.  Thank you!  Jan

## 2019-04-21 NOTE — Telephone Encounter (Signed)
Routing to Dr. Miller

## 2019-04-21 NOTE — Progress Notes (Signed)
Subjective:    CC: Recheck elbow  HPI: Amanda Evans is a pleasant 58 year old female, we been treating her for lateral epicondylitis, she has really not been diligent with her rehab exercises and still has some pain.  Moderate, persistent, localized at the lateral epicondyle without radiation.  She also had some rheumatoid testing done by her gynecologist, she is looking for an explanation of the results.  I reviewed the past medical history, family history, social history, surgical history, and allergies today and no changes were needed.  Please see the problem list section below in epic for further details.  Past Medical History: Past Medical History:  Diagnosis Date  . HLD (hyperlipidemia)   . STD (sexually transmitted disease)    HSV  . Urinary incontinence    Past Surgical History: Past Surgical History:  Procedure Laterality Date  . CESAREAN SECTION    . [place] ABLATION  10/06/2010   in office, had vasovagal response   Social History: Social History   Socioeconomic History  . Marital status: Married    Spouse name: Not on file  . Number of children: 1  . Years of education: Not on file  . Highest education level: Not on file  Occupational History  . Occupation: GRAPHIC Games developer: [employer]  Social Needs  . Financial resource strain: Not on file  . Food insecurity    Worry: Not on file    Inability: Not on file  . Transportation needs    Medical: Not on file    Non-medical: Not on file  Tobacco Use  . Smoking status: Never Smoker  . Smokeless tobacco: Never Used  Substance and Sexual Activity  . Alcohol use: Yes    Alcohol/week: 0.0 - 1.0 standard drinks  . Drug use: No  . Sexual activity: Yes    Comment: vasectomy  Lifestyle  . Physical activity    Days per week: Not on file    Minutes per session: Not on file  . Stress: Not on file  Relationships  . Social Herbalist on phone: Not on file    Gets together: Not on file    Attends  religious service: Not on file    Active member of club or organization: Not on file    Attends meetings of clubs or organizations: Not on file    Relationship status: Not on file  Other Topics Concern  . Not on file  Social History Narrative  . Not on file   Family History: Family History  Problem Relation Age of Onset  . Hyperlipidemia Mother   . Thyroid disease Mother   . Osteoporosis Mother   . Hyperlipidemia Father   . Heart attack Father   . Heart disease Father   . Transient ischemic attack Father   . Prostate cancer Father        dx. 60-71  . Melanoma Father        dx. >61  . Colon cancer Sister        dx. 58-59  . Breast cancer Maternal Grandmother        dx. 70 or younger  . Heart disease Paternal Grandmother   . Heart disease Paternal Grandfather   . Breast cancer Sister 8       mastectomy  . Brain cancer Maternal Uncle        unspecified type  . Cirrhosis Maternal Uncle        +EtOH  . Heart Problems Paternal  Uncle   . Heart Problems Maternal Grandfather   . Testicular cancer Maternal Uncle        dx. early 49s  . Lung cancer Cousin        d. late 2s; smoker  . Testicular cancer Cousin 21  . Melanoma Cousin 27  . Sudden death Neg Hx   . Hypertension Neg Hx   . Diabetes Neg Hx    Allergies: Allergies  Allergen Reactions  . Naproxen Sodium     REACTION: hives  . Penicillins     REACTION: swelling   Medications: See med rec.  Review of Systems: No fevers, chills, night sweats, weight loss, chest pain, or shortness of breath.   Objective:    General: Well Developed, well nourished, and in no acute distress.  Neuro: Alert and oriented x3, extra-ocular muscles intact, sensation grossly intact.  HEENT: Normocephalic, atraumatic, pupils equal round reactive to light, neck supple, no masses, no lymphadenopathy, thyroid nonpalpable.  Skin: Warm and dry, no rashes. Cardiac: Regular rate and rhythm, no murmurs rubs or gallops, no lower extremity  edema.  Respiratory: Clear to auscultation bilaterally. Not using accessory muscles, speaking in full sentences. Right elbow: Unremarkable to inspection. Range of motion full pronation, supination, flexion, extension. Strength is full to all of the above directions Stable to varus, valgus stress. Negative moving valgus stress test. Minimally tender at the common extensor tendon origin Ulnar nerve does not sublux. Negative cubital tunnel Tinel's.  Impression and Recommendations:    Lateral epicondylitis, right elbow Increase diligence with the rehab exercises, continue meloxicam. Return in a month for this. She does have some gelling, and pain consistent with osteoarthritis, her OB/GYN did a good work-up that was negative for autoimmune disease or rheumatoid arthritis.  I spent 25 minutes with this patient, greater than 50% was face-to-face time counseling regarding the above diagnoses.  ___________________________________________ Gwen Her. Dianah Field, M.D., ABFM., CAQSM. Primary Care and Sports Medicine  [facility]  Adjunct Professor of [place] [employer] [facility] [employer]

## 2019-04-29 ENCOUNTER — Telehealth: Payer: Self-pay | Admitting: Obstetrics & Gynecology

## 2019-04-29 NOTE — Telephone Encounter (Signed)
Patient would like a prescription for estradiol with testosterone. [facility] pharmacy [phone].

## 2019-04-29 NOTE — Telephone Encounter (Signed)
Spoke with patient. Patient requesting Rx for compounded testosterone cream and estradiol to [facility]. 12/22/18 total testosterone 20.5, was given option for testosterone cream, declined at that time.   Advised I will forward to Dr. Sabra Heck to review, will return call with recommendations once advised. Advised to allow 48 hrs for compounded Rx to be prepared at pharmacy once RX received. Patient verbalizes understanding.   Dr. Sabra Heck -please advise on Rx.

## 2019-05-05 ENCOUNTER — Other Ambulatory Visit: Payer: Self-pay | Admitting: Obstetrics & Gynecology

## 2019-05-05 MED ORDER — NONFORMULARY OR COMPOUNDED ITEM: 1 refills | Status: DC

## 2019-05-05 NOTE — Telephone Encounter (Signed)
Rx for estriol /testosterone vaginal cream faxed to [place].   Call returned to patient, advised per Dr. Sabra Heck. Rx has been faxed to [place], they will contact you when Rx is ready, allow 48 hrs. Reviewed Rx instructions. Lab appt scheduled for 10/6 at 9:45am. Patient verbalize understating and is agreeable.   Encounter closed.

## 2019-05-05 NOTE — Telephone Encounter (Signed)
Routing to Dr. Sabra Heck to review and advise on RX.

## 2019-05-05 NOTE — Telephone Encounter (Signed)
rx has been sent.  It is an ointment and she will use 1/4 tsp to skin two times weekly.  Will need 3 month testosterone level recheck if feels helping and plans to continue.  [facility] faxed rx.  Thanks.

## 2019-05-05 NOTE — Telephone Encounter (Signed)
Patient calling for update on compounded testosterone cream to be sent to [facility]. States it is needed ASAP.

## 2019-05-05 NOTE — Progress Notes (Signed)
Order for compounded testosterone sent to pharmacy.

## 2019-05-06 ENCOUNTER — Other Ambulatory Visit: Payer: Self-pay | Admitting: *Deleted

## 2019-05-06 DIAGNOSIS — R6882 Decreased libido: Secondary | ICD-10-CM

## 2019-05-19 ENCOUNTER — Encounter: Payer: Self-pay | Admitting: Sports Medicine

## 2019-05-19 ENCOUNTER — Ambulatory Visit (INDEPENDENT_AMBULATORY_CARE_PROVIDER_SITE_OTHER): Payer: 59 | Admitting: Sports Medicine

## 2019-05-19 ENCOUNTER — Other Ambulatory Visit: Payer: Self-pay

## 2019-05-19 DIAGNOSIS — Z13820 Encounter for screening for osteoporosis: Secondary | ICD-10-CM | POA: Diagnosis not present

## 2019-05-19 DIAGNOSIS — M7711 Lateral epicondylitis, right elbow: Secondary | ICD-10-CM | POA: Diagnosis not present

## 2019-05-19 NOTE — Progress Notes (Signed)
Subjective:    CC: Right elbow pain  HPI: This is a pleasant 58 year old female, we have been treating Evans for right lateral epicondylitis, she improved only slightly with rehabilitation exercises, activity modification, counterforce bracing.  Evans pain is moderate, persistent, localized to the common extensor tendon origin without radiation.  In addition she has some questions about osteoporosis and bone density.  I reviewed the past medical history, family history, social history, surgical history, and allergies today and no changes were needed.  Please see the problem list section below in epic for further details.  Past Medical History: Past Medical History:  Diagnosis Date   HLD (hyperlipidemia)    STD (sexually transmitted disease)    HSV   Urinary incontinence    Past Surgical History: Past Surgical History:  Procedure Laterality Date   CESAREAN SECTION     NOVASURE ABLATION  10/06/2010   in office, had vasovagal response   Social History: Social History   Socioeconomic History   Marital status: Married    Spouse name: Not on file   Number of children: 1   Years of education: Not on file   Highest education level: Not on file  Occupational History   Occupation: GRAPHIC DESIGNER    Employer: [employer] resource strain: Not on file   Food insecurity    Worry: Not on file    Inability: Not on file   Transportation needs    Medical: Not on file    Non-medical: Not on file  Tobacco Use   Smoking status: Never Smoker   Smokeless tobacco: Never Used  Substance and Sexual Activity   Alcohol use: Yes    Alcohol/week: 0.0 - 1.0 standard drinks   Drug use: No   Sexual activity: Yes    Comment: vasectomy  Lifestyle   Physical activity    Days per week: Not on file    Minutes per session: Not on file   Stress: Not on file  Relationships   Social connections    Talks on phone: Not on file    Gets together: Not on  file    Attends religious service: Not on file    Active member of club or organization: Not on file    Attends meetings of clubs or organizations: Not on file    Relationship status: Not on file  Other Topics Concern   Not on file  Social History Narrative   Not on file   Family History: Family History  Problem Relation Age of Onset   Hyperlipidemia Mother    Thyroid disease Mother    Osteoporosis Mother    Hyperlipidemia Father    Heart attack Father    Heart disease Father    Transient ischemic attack Father    Prostate cancer Father        dx. 29-71   Melanoma Father        dx. >49   Colon cancer Sister        dx. 58-59   Breast cancer Maternal Grandmother        dx. 48 or younger   Heart disease Paternal Grandmother    Heart disease Paternal Grandfather    Breast cancer Sister 2       mastectomy   Brain cancer Maternal Uncle        unspecified type   Cirrhosis Maternal Uncle        +EtOH   Heart Problems Paternal Uncle  Heart Problems Maternal Grandfather    Testicular cancer Maternal Uncle        dx. early 37s   Lung cancer Cousin        d. late 83s; smoker   Testicular cancer Cousin 33   Melanoma Cousin 40   Sudden death Neg Hx    Hypertension Neg Hx    Diabetes Neg Hx    Allergies: Allergies  Allergen Reactions   Naproxen Sodium     REACTION: hives   Penicillins     REACTION: swelling   Medications: See med rec.  Review of Systems: No fevers, chills, night sweats, weight loss, chest pain, or shortness of breath.   Objective:    General: Well Developed, well nourished, and in no acute distress.  Neuro: Alert and oriented x3, extra-ocular muscles intact, sensation grossly intact.  HEENT: Normocephalic, atraumatic, pupils equal round reactive to light, neck supple, no masses, no lymphadenopathy, thyroid nonpalpable.  Skin: Warm and dry, no rashes. Cardiac: Regular rate and rhythm, no murmurs rubs or gallops, no  lower extremity edema.  Respiratory: Clear to auscultation bilaterally. Not using accessory muscles, speaking in full sentences. Right elbow: Unremarkable to inspection. Range of motion full pronation, supination, flexion, extension. Strength is full to all of the above directions Stable to varus, valgus stress. Negative moving valgus stress test. Severe tenderness at the common extensor tendon origin Ulnar nerve does not sublux. Negative cubital tunnel Tinel's.  Procedure: Real-time Ultrasound Guided injection of the right common extensor tendon origin  device: GE Logiq E  Verbal informed consent obtained.  Time-out conducted.  Noted no overlying erythema, induration, or other signs of local infection.  Skin prepped in a sterile fashion.  Local anesthesia: Topical Ethyl chloride.  With sterile technique and under real time ultrasound guidance:  1 cc Kenalog 40, 1 cc lidocaine, 1 cc bupivacaine injected both superficial to and deep to the common extensor tendon at the lateral epicondyle. Completed without difficulty  Pain immediately resolved suggesting accurate placement of the medication.  Advised to call if fevers/chills, erythema, induration, drainage, or persistent bleeding.  Images permanently stored and available for review in the ultrasound unit.  Impression: Technically successful ultrasound guided injection.  Impression and Recommendations:    Lateral epicondylitis, right elbow Failed conservative measures, injection today, continue brace, rehab exercises. Return in 1 month.  Screening for osteoporosis Calcium and vitamin D supplementation twice a day. Adding bone densitometry testing.   ___________________________________________ Amanda Evans. Dianah Field, M.D., ABFM., CAQSM. Primary Care and Sports Medicine [facility]  Adjunct Professor of [place] of [facility] of [employer]

## 2019-05-19 NOTE — Assessment & Plan Note (Signed)
Calcium and vitamin D supplementation twice a day. Adding bone densitometry testing.

## 2019-05-19 NOTE — Assessment & Plan Note (Signed)
Failed conservative measures, injection today, continue brace, rehab exercises. Return in 1 month.

## 2019-06-02 DIAGNOSIS — H5213 Myopia, bilateral: Secondary | ICD-10-CM | POA: Diagnosis not present

## 2019-06-02 MED FILL — GABAPENTIN 300 MG CAPSULE: 300 | 90 days supply | Qty: 90 | Fill #1

## 2019-06-16 ENCOUNTER — Ambulatory Visit: Payer: 59 | Admitting: Sports Medicine

## 2019-06-17 ENCOUNTER — Ambulatory Visit (INDEPENDENT_AMBULATORY_CARE_PROVIDER_SITE_OTHER): Payer: 59

## 2019-06-17 ENCOUNTER — Ambulatory Visit (INDEPENDENT_AMBULATORY_CARE_PROVIDER_SITE_OTHER): Payer: 59 | Admitting: Sports Medicine

## 2019-06-17 ENCOUNTER — Encounter: Payer: Self-pay | Admitting: Sports Medicine

## 2019-06-17 ENCOUNTER — Other Ambulatory Visit: Payer: Self-pay

## 2019-06-17 DIAGNOSIS — M7711 Lateral epicondylitis, right elbow: Secondary | ICD-10-CM

## 2019-06-17 DIAGNOSIS — M85851 Other specified disorders of bone density and structure, right thigh: Secondary | ICD-10-CM | POA: Diagnosis not present

## 2019-06-17 DIAGNOSIS — Z780 Asymptomatic menopausal state: Secondary | ICD-10-CM | POA: Diagnosis not present

## 2019-06-17 DIAGNOSIS — Z13820 Encounter for screening for osteoporosis: Secondary | ICD-10-CM | POA: Diagnosis not present

## 2019-06-17 NOTE — Assessment & Plan Note (Signed)
Pain-free now after injection, may return to exercise, but cut back weights to begin with. Return to see me as needed.

## 2019-06-17 NOTE — Progress Notes (Signed)
Subjective:    CC: Follow-up  HPI: Right-sided tennis elbow: Resolved now.  I reviewed the past medical history, family history, social history, surgical history, and allergies today and no changes were needed.  Please see the problem list section below in epic for further details.  Past Medical History: Past Medical History:  Diagnosis Date  . HLD (hyperlipidemia)   . STD (sexually transmitted disease)    HSV  . Urinary incontinence    Past Surgical History: Past Surgical History:  Procedure Laterality Date  . CESAREAN SECTION    . [place] ABLATION  10/06/2010   in office, had vasovagal response   Social History: Social History   Socioeconomic History  . Marital status: Married    Spouse name: Not on file  . Number of children: 1  . Years of education: Not on file  . Highest education level: Not on file  Occupational History  . Occupation: GRAPHIC Games developer: [employer]  Social Needs  . Financial resource strain: Not on file  . Food insecurity    Worry: Not on file    Inability: Not on file  . Transportation needs    Medical: Not on file    Non-medical: Not on file  Tobacco Use  . Smoking status: Never Smoker  . Smokeless tobacco: Never Used  Substance and Sexual Activity  . Alcohol use: Yes    Alcohol/week: 0.0 - 1.0 standard drinks  . Drug use: No  . Sexual activity: Yes    Comment: vasectomy  Lifestyle  . Physical activity    Days per week: Not on file    Minutes per session: Not on file  . Stress: Not on file  Relationships  . Social Herbalist on phone: Not on file    Gets together: Not on file    Attends religious service: Not on file    Active member of club or organization: Not on file    Attends meetings of clubs or organizations: Not on file    Relationship status: Not on file  Other Topics Concern  . Not on file  Social History Narrative  . Not on file   Family History: Family History  Problem Relation Age of  Onset  . Hyperlipidemia Mother   . Thyroid disease Mother   . Osteoporosis Mother   . Hyperlipidemia Father   . Heart attack Father   . Heart disease Father   . Transient ischemic attack Father   . Prostate cancer Father        dx. 89-71  . Melanoma Father        dx. >56  . Colon cancer Sister        dx. 58-59  . Breast cancer Maternal Grandmother        dx. 32 or younger  . Heart disease Paternal Grandmother   . Heart disease Paternal Grandfather   . Breast cancer Sister 65       mastectomy  . Brain cancer Maternal Uncle        unspecified type  . Cirrhosis Maternal Uncle        +EtOH  . Heart Problems Paternal Uncle   . Heart Problems Maternal Grandfather   . Testicular cancer Maternal Uncle        dx. early 75s  . Lung cancer Cousin        d. late 60s; smoker  . Testicular cancer Cousin 79  . Melanoma Cousin 3  .  Sudden death Neg Hx   . Hypertension Neg Hx   . Diabetes Neg Hx    Allergies: Allergies  Allergen Reactions  . Naproxen Sodium     REACTION: hives  . Penicillins     REACTION: swelling   Medications: See med rec.  Review of Systems: No fevers, chills, night sweats, weight loss, chest pain, or shortness of breath.   Objective:    General: Well Developed, well nourished, and in no acute distress.  Neuro: Alert and oriented x3, extra-ocular muscles intact, sensation grossly intact.  HEENT: Normocephalic, atraumatic, pupils equal round reactive to light, neck supple, no masses, no lymphadenopathy, thyroid nonpalpable.  Skin: Warm and dry, no rashes. Cardiac: Regular rate and rhythm, no murmurs rubs or gallops, no lower extremity edema.  Respiratory: Clear to auscultation bilaterally. Not using accessory muscles, speaking in full sentences. Right elbow: Unremarkable to inspection. Range of motion full pronation, supination, flexion, extension. Strength is full to all of the above directions Stable to varus, valgus stress. Negative moving valgus  stress test. No discrete areas of tenderness to palpation. Ulnar nerve does not sublux. Negative cubital tunnel Tinel's.  Impression and Recommendations:    Lateral epicondylitis, right elbow Pain-free now after injection, may return to exercise, but cut back weights to begin with. Return to see me as needed.   ___________________________________________ Gwen Her. Dianah Field, M.D., ABFM., CAQSM. Primary Care and Sports Medicine [facility]  Adjunct Professor of [place] of [facility] of Medicine

## 2019-06-22 ENCOUNTER — Other Ambulatory Visit: Payer: Self-pay | Admitting: Obstetrics & Gynecology

## 2019-06-22 NOTE — Telephone Encounter (Signed)
Patient is asking for a refill of her antibiotic. She is out of town.  [facility] [address] [place], [place] [phone]

## 2019-06-22 NOTE — Telephone Encounter (Signed)
Patient called again to request status of Macrobid prescription. She is not currently having any symptoms but usually gets UTI after intercourse and would like to have this on hand. Please advise.

## 2019-06-23 MED ORDER — NITROFURANTOIN MONOHYD MACRO 100 MG PO CAPS: 100.0000 mg | ORAL_CAPSULE | ORAL | 0 refills | Status: DC | PRN

## 2019-06-23 NOTE — Telephone Encounter (Signed)
This refill request was just sent to me today so it has been completed.  Please let her know.  Thanks.

## 2019-06-23 NOTE — Telephone Encounter (Signed)
Medication refill request: Macrobid Last AEX:  12/22/18 SM Next AEX: 05/05/20 Last MMG (if hormonal medication request): 05/02/18 BIRADS 1 negative/density d Refill authorized: Please advise on refill

## 2019-07-24 ENCOUNTER — Other Ambulatory Visit: Payer: Self-pay | Admitting: Obstetrics & Gynecology

## 2019-07-24 MED ORDER — NONFORMULARY OR COMPOUNDED ITEM: 1 refills | Status: DC

## 2019-07-24 NOTE — Telephone Encounter (Signed)
Patient needs refill on estriol/testosterone cream. Has one week left.

## 2019-07-24 NOTE — Telephone Encounter (Signed)
Rx faxed to [facility] today

## 2019-07-24 NOTE — Telephone Encounter (Signed)
Medication refill request: estriol 0.25mg /testosterone 0.25mg  vaginal cream Last AEX:  12-22-18 Next AEX: 05-05-20 Last MMG (if hormonal medication request): 05-02-18 category d density birads 1:neg Refill authorized: please approve if appropriate

## 2019-08-11 ENCOUNTER — Other Ambulatory Visit: Payer: Self-pay

## 2019-08-11 ENCOUNTER — Other Ambulatory Visit (INDEPENDENT_AMBULATORY_CARE_PROVIDER_SITE_OTHER): Payer: 59

## 2019-08-11 DIAGNOSIS — R6882 Decreased libido: Secondary | ICD-10-CM

## 2019-08-16 LAB — TESTOSTERONE, TOTAL, LC/MS/MS: Testosterone, total: 94.9 ng/dL

## 2019-08-19 ENCOUNTER — Other Ambulatory Visit: Payer: Self-pay | Admitting: Obstetrics & Gynecology

## 2019-08-19 DIAGNOSIS — Z1231 Encounter for screening mammogram for malignant neoplasm of breast: Secondary | ICD-10-CM

## 2019-09-01 MED FILL — GABAPENTIN 300 MG CAPSULE: 300 | 90 days supply | Qty: 90 | Fill #2

## 2019-10-05 DIAGNOSIS — H43811 Vitreous degeneration, right eye: Secondary | ICD-10-CM | POA: Diagnosis not present

## 2019-10-05 DIAGNOSIS — H538 Other visual disturbances: Secondary | ICD-10-CM | POA: Diagnosis not present

## 2019-10-06 DIAGNOSIS — L03213 Periorbital cellulitis: Secondary | ICD-10-CM | POA: Diagnosis not present

## 2019-10-06 MED FILL — SULFAMETHOXAZOLE-TMP DS TAB: 800-160 | 10 days supply | Qty: 20 | Fill #0

## 2019-10-06 MED FILL — POLYMYXIN B/TMP EYE DROPS: 10000-0.1 | 50 days supply | Qty: 10 | Fill #0

## 2019-10-07 ENCOUNTER — Other Ambulatory Visit: Payer: Self-pay

## 2019-10-07 ENCOUNTER — Ambulatory Visit
Admission: RE | Admit: 2019-10-07 | Discharge: 2019-10-07 | Disposition: A | Discharge status: Home / Self Care | Payer: 59 | Source: Ambulatory Visit | Attending: Obstetrics & Gynecology | Admitting: Obstetrics & Gynecology

## 2019-10-07 DIAGNOSIS — Z1231 Encounter for screening mammogram for malignant neoplasm of breast: Secondary | ICD-10-CM | POA: Diagnosis not present

## 2019-10-13 DIAGNOSIS — L03213 Periorbital cellulitis: Secondary | ICD-10-CM | POA: Diagnosis not present

## 2019-10-29 MED FILL — SULFAMETHOXAZOLE-TMP DS TAB: 800-160 | 10 days supply | Qty: 20 | Fill #0

## 2019-11-13 DIAGNOSIS — H01001 Unspecified blepharitis right upper eyelid: Secondary | ICD-10-CM | POA: Diagnosis not present

## 2019-11-13 DIAGNOSIS — H01004 Unspecified blepharitis left upper eyelid: Secondary | ICD-10-CM | POA: Diagnosis not present

## 2019-11-13 DIAGNOSIS — H0012 Chalazion right lower eyelid: Secondary | ICD-10-CM | POA: Diagnosis not present

## 2019-11-17 ENCOUNTER — Other Ambulatory Visit: Payer: Self-pay

## 2019-11-17 ENCOUNTER — Telehealth: Payer: Self-pay | Admitting: Obstetrics & Gynecology

## 2019-11-17 NOTE — Telephone Encounter (Signed)
Patient would like to know if she is required to fast for lab to check testosterone level tomorrow morning.

## 2019-11-17 NOTE — Telephone Encounter (Signed)
Spoke with patient. Advised she does not need to fast for testosterone labs. Question answered. Patient verbalizes understanding and is agreeable.   Encounter closed.

## 2019-11-18 ENCOUNTER — Other Ambulatory Visit: Payer: Self-pay | Admitting: *Deleted

## 2019-11-18 ENCOUNTER — Other Ambulatory Visit (INDEPENDENT_AMBULATORY_CARE_PROVIDER_SITE_OTHER): Payer: 59

## 2019-11-18 DIAGNOSIS — R6882 Decreased libido: Secondary | ICD-10-CM

## 2019-11-21 LAB — TESTOSTERONE, TOTAL, LC/MS/MS: Testosterone, total: 21.4 ng/dL

## 2019-11-23 ENCOUNTER — Other Ambulatory Visit: Payer: Self-pay | Admitting: Obstetrics & Gynecology

## 2019-11-23 MED ORDER — NITROFURANTOIN MONOHYD MACRO 100 MG PO CAPS: 100.0000 mg | ORAL_CAPSULE | ORAL | 0 refills | Status: DC | PRN

## 2019-11-23 MED ORDER — GABAPENTIN 300 MG PO CAPS: ORAL_CAPSULE | ORAL | 2 refills | Status: DC

## 2019-11-23 MED FILL — NITROFURANTOIN MONO-MCR 100: 100 | 15 days supply | Qty: 30 | Fill #0

## 2019-11-23 MED FILL — GABAPENTIN 300 MG CAPSULE: 300 | 18 days supply | Qty: 30 | Fill #0

## 2019-11-23 NOTE — Telephone Encounter (Signed)
Routing to Dr. Miller

## 2019-12-02 DIAGNOSIS — H0012 Chalazion right lower eyelid: Secondary | ICD-10-CM | POA: Diagnosis not present

## 2019-12-02 MED FILL — NEO/POLY/DEXAMET EYE OINT: 3.5-10000-0 | 7 days supply | Qty: 4 | Fill #0

## 2019-12-02 MED FILL — DOXYCYCLINE HYCLATE 100 MG: 100 | 21 days supply | Qty: 21 | Fill #0

## 2019-12-31 MED FILL — GABAPENTIN 300 MG CAPSULE: 300 | 18 days supply | Qty: 30 | Fill #1

## 2020-01-19 ENCOUNTER — Encounter: Payer: Self-pay | Admitting: Obstetrics & Gynecology

## 2020-01-19 ENCOUNTER — Other Ambulatory Visit: Payer: Self-pay

## 2020-01-19 ENCOUNTER — Other Ambulatory Visit: Payer: Self-pay | Admitting: Obstetrics & Gynecology

## 2020-01-19 ENCOUNTER — Ambulatory Visit (INDEPENDENT_AMBULATORY_CARE_PROVIDER_SITE_OTHER): Payer: 59 | Admitting: Obstetrics & Gynecology

## 2020-01-19 VITALS — BP 114/68 | HR 84 | Temp 98.2°F | Resp 10 | Ht 63.75 in | Wt 114.0 lb

## 2020-01-19 DIAGNOSIS — Z01419 Encounter for gynecological examination (general) (routine) without abnormal findings: Secondary | ICD-10-CM | POA: Diagnosis not present

## 2020-01-19 DIAGNOSIS — Z0000 Encounter for general adult medical examination without abnormal findings: Secondary | ICD-10-CM | POA: Diagnosis not present

## 2020-01-19 MED ORDER — GABAPENTIN 300 MG PO CAPS: ORAL_CAPSULE | ORAL | 4 refills | Status: DC

## 2020-01-19 MED FILL — GABAPENTIN 300 MG CAPSULE: 300 | 47 days supply | Qty: 90 | Fill #0

## 2020-01-19 NOTE — Progress Notes (Signed)
59 y.o. G55P1011 Married White or Caucasian female here for annual exam.  Doing well.   Patient would like to discus bone density from August 2020.  Has some osteopenia in hips.  States her hips are very stiff and sore when she first stands up.  She does exercise 3-4 days a week.  Ortho referral.    Denies vaginal bleeding.    Still having hot flashes that is improved.  Using vaginal estrogen/testosterone.    Has completed the Covid vaccination.   No LMP recorded. Patient has had an ablation.          Sexually active: Yes.    The current method of family planning is post menopausal status.    Exercising: Yes.    Jazzercise Smoker:  no  Health Maintenance: Pap:   12/22/18 ASCUS:Neg HR HPV  10/19/15 neg. HR HPV:neg              09/25/13 Neg History of abnormal Pap:  yes MMG:  10/07/19 BIRADS 1 negative/density d Colonoscopy:  01/13/16 f/u 5 years BMD:  06/17/19 Osteopenia TDaP:  2014 Pneumonia vaccine(s):  n/a Shingrix:   No  Hep C testing: 10/19/15 neg  Screening Labs: discuss today   reports that she has never smoked. She has never used smokeless tobacco. She reports current alcohol use. She reports that she does not use drugs.  Past Medical History:  Diagnosis Date  . HLD (hyperlipidemia)   . STD (sexually transmitted disease)    HSV  . Urinary incontinence     Past Surgical History:  Procedure Laterality Date  . BREAST EXCISIONAL BIOPSY Right   . CESAREAN SECTION    . NOVASURE ABLATION  10/06/2010   in office, had vasovagal response    Current Outpatient Medications  Medication Sig Dispense Refill  . Ascorbic Acid (VITAMIN C PO) Take by mouth.    . Biotin 5000 MCG CAPS Take 1 capsule by mouth daily.    . Black Cohosh 40 MG CAPS Take by mouth.    . Calcium-Vitamin A-Vitamin D (LIQUID CALCIUM PO) Take 1,000 mg by mouth daily.    Marland Kitchen gabapentin (NEURONTIN) 300 MG capsule 1 capsule nightly for 4-7 nights.  Then increase to 2 capsules nightly if needed. 30 capsule 2  .  Ginkgo Biloba 100 MG CAPS Take 1 capsule by mouth daily.    Marland Kitchen neomycin-polymyxin b-dexamethasone (MAXITROL) 3.5-10000-0.1 OINT as needed.    . nitrofurantoin, macrocrystal-monohydrate, (MACROBID) 100 MG capsule Take 1 capsule (100 mg total) by mouth as needed (post coitally; increase to BID x3 days with symptoms). 30 capsule 0  . NONFORMULARY OR COMPOUNDED ITEM Estriol 0.25/Testosterone 0.25mg /ml intravaginal cream.  53ml twice weekly.  Disp: 3 month supply. 1 each 1  . Omega-3 Fatty Acids (FISH OIL) 1000 MG CAPS Take 1 capsule by mouth daily.    . TURMERIC PO Take by mouth daily.    Marland Kitchen trimethoprim-polymyxin b (POLYTRIM) ophthalmic solution      No current facility-administered medications for this visit.    Family History  Problem Relation Age of Onset  . Hyperlipidemia Mother   . Thyroid disease Mother   . Osteoporosis Mother   . Hyperlipidemia Father   . Heart attack Father   . Heart disease Father   . Transient ischemic attack Father   . Prostate cancer Father        dx. 41-71  . Melanoma Father        dx. >35  . Colon cancer Sister  dx. 58-59  . Breast cancer Maternal Grandmother        dx. 47 or younger  . Heart disease Paternal Grandmother   . Heart disease Paternal Grandfather   . Breast cancer Sister 51       mastectomy  . Brain cancer Maternal Uncle        unspecified type  . Cirrhosis Maternal Uncle        +EtOH  . Heart Problems Paternal Uncle   . Heart Problems Maternal Grandfather   . Testicular cancer Maternal Uncle        dx. early 77s  . Lung cancer Cousin        d. late 70s; smoker  . Testicular cancer Cousin 21  . Melanoma Cousin 61  . Sudden death Neg Hx   . Hypertension Neg Hx   . Diabetes Neg Hx     Review of Systems  All other systems reviewed and are negative.   Exam:   BP 114/68 (BP Location: Right Arm, Patient Position: Sitting, Cuff Size: Normal)   Pulse 84   Temp 98.2 F (36.8 C) (Temporal)   Resp 10   Ht 5' 3.75" (1.619 m)    Wt 114 lb (51.7 kg)   BMI 19.72 kg/m    Height: 5' 3.75" (161.9 cm)  Ht Readings from Last 3 Encounters:  01/19/20 5' 3.75" (1.619 m)  06/17/19 5' 3.75" (1.619 m)  05/19/19 5' 3.75" (1.619 m)    General appearance: alert, cooperative and appears stated age Head: Normocephalic, without obvious abnormality, atraumatic Neck: no adenopathy, supple, symmetrical, trachea midline and thyroid normal to inspection and palpation Lungs: clear to auscultation bilaterally Breasts: normal appearance, no masses or tenderness Heart: regular rate and rhythm Abdomen: soft, non-tender; bowel sounds normal; no masses,  no organomegaly Extremities: extremities normal, atraumatic, no cyanosis or edema Skin: Skin color, texture, turgor normal. No rashes or lesions Lymph nodes: Cervical, supraclavicular, and axillary nodes normal. No abnormal inguinal nodes palpated Neurologic: Grossly normal   Pelvic: External genitalia:  no lesions              Urethra:  normal appearing urethra with no masses, tenderness or lesions              Bartholins and Skenes: normal                 Vagina: normal appearing vagina with normal color and discharge, no lesions              Cervix: no lesions              Pap taken: No. Bimanual Exam:  Uterus:  normal size, contour, position, consistency, mobility, non-tender              Adnexa: normal adnexa and no mass, fullness, tenderness               Rectovaginal: Confirms               Anus:  normal sphincter tone, no lesions  Chaperone, Terence Lux, CMA, was present for exam.  A:  Well Woman with normal exam H/O Novasure endometrial ablation 12/11 Grade D breast density H/o HSV family hx of colon cancer.  Did have genetics appt.  Declined testing due to cost H/O recurrent UTIs Menopausal symptoms  P:   Mammogram guidelines reviewed.  Doing 3D MMG pap smear ASCUS with neg HR HPV 2020.  Pt is aware that she needs follow up in 2023  Will return for FLP,  CMP Does not need refill for vaginal estrogen/testosterone cream.  Will let me know when RF is needed Does not need RF for macrobid.  Will let me know when needs RF. Colonoscopy is UTD RF gabapentin 300mg  nightly.  #90/4RF. Return annually or prn

## 2020-01-20 ENCOUNTER — Other Ambulatory Visit (INDEPENDENT_AMBULATORY_CARE_PROVIDER_SITE_OTHER): Payer: 59

## 2020-01-20 DIAGNOSIS — Z0000 Encounter for general adult medical examination without abnormal findings: Secondary | ICD-10-CM | POA: Diagnosis not present

## 2020-01-21 LAB — LIPID PANEL
Chol/HDL Ratio: 2.8 ratio (ref 0.0–4.4)
Cholesterol, Total: 223 mg/dL — ABNORMAL HIGH (ref 100–199)
HDL: 80 mg/dL (ref >39)
LDL Chol Calc (NIH): 135 mg/dL — ABNORMAL HIGH (ref 0–99)
Triglycerides: 48 mg/dL (ref 0–149)
VLDL Cholesterol Cal: 8 mg/dL (ref 5–40)

## 2020-01-21 LAB — COMPREHENSIVE METABOLIC PANEL
ALT: 23 IU/L (ref 0–32)
AST: 24 IU/L (ref 0–40)
Albumin/Globulin Ratio: 2.3 — ABNORMAL HIGH (ref 1.2–2.2)
Albumin: 4.6 g/dL (ref 3.8–4.9)
Alkaline Phosphatase: 54 IU/L (ref 39–117)
BUN/Creatinine Ratio: 16 (ref 9–23)
BUN: 14 mg/dL (ref 6–24)
Bilirubin Total: 0.5 mg/dL (ref 0.0–1.2)
CO2: 27 mmol/L (ref 20–29)
Calcium: 9.5 mg/dL (ref 8.7–10.2)
Chloride: 105 mmol/L (ref 96–106)
Creatinine, Ser: 0.86 mg/dL (ref 0.57–1.00)
GFR calc Af Amer: 87 mL/min/{1.73_m2} (ref >59)
GFR calc non Af Amer: 76 mL/min/{1.73_m2} (ref >59)
Globulin, Total: 2 g/dL (ref 1.5–4.5)
Glucose: 82 mg/dL (ref 65–99)
Potassium: 4.7 mmol/L (ref 3.5–5.2)
Sodium: 143 mmol/L (ref 134–144)
Total Protein: 6.6 g/dL (ref 6.0–8.5)

## 2020-02-29 ENCOUNTER — Telehealth: Payer: Self-pay | Admitting: Obstetrics & Gynecology

## 2020-02-29 MED FILL — GABAPENTIN 300 MG CAPSULE: 300 | 45 days supply | Qty: 90 | Fill #0

## 2020-02-29 NOTE — Telephone Encounter (Signed)
Patient is requesting a refill of gabapentin to [facility].

## 2020-02-29 NOTE — Telephone Encounter (Signed)
Gabapentin was sent to pharmacy 01/19/20 #90 w/4 refills to [facility]. Contacted pharmacy to confirm that they have refills for patient's prescription. Pharmacy to get prescription ready and patient notified. Okay to close encounter.

## 2020-03-18 ENCOUNTER — Telehealth: Payer: Self-pay | Admitting: Obstetrics & Gynecology

## 2020-03-18 MED ORDER — NITROFURANTOIN MONOHYD MACRO 100 MG PO CAPS: 100.0000 mg | ORAL_CAPSULE | ORAL | 0 refills | Status: DC | PRN

## 2020-03-18 NOTE — Telephone Encounter (Signed)
Spoke with patient. Hx of recurrent UTIs, was seen in office on 01/19/20, advised she would call when ready for refill of Macrobid. Patient is traveling, denies any current symptoms, [facility] [place] [place] Advised patient I will send RX request to Dr. Sabra Heck for review, advised she is seeing patients, response may not be immediate, f/u with pharmacy later this evening. Patient agreeable.   Rx pended for Macrobid 100 mg caps #30/0RF Take 1 capsule (100 mg total) by mouth as needed (post coitally; increase to BID x3 days with symptoms).  Routing to Dr. Sabra Heck to review refill request.

## 2020-03-18 NOTE — Telephone Encounter (Signed)
Patient left message on answering machine requesting refill on antibiotics.

## 2020-03-18 NOTE — Telephone Encounter (Signed)
Patient notified of Rx.  

## 2020-03-18 NOTE — Telephone Encounter (Signed)
Rx completed.  Thank you for the update.

## 2020-04-05 ENCOUNTER — Encounter: Payer: Self-pay | Admitting: Sports Medicine

## 2020-04-05 ENCOUNTER — Ambulatory Visit (INDEPENDENT_AMBULATORY_CARE_PROVIDER_SITE_OTHER): Payer: 59 | Admitting: Sports Medicine

## 2020-04-05 DIAGNOSIS — M7711 Lateral epicondylitis, right elbow: Secondary | ICD-10-CM

## 2020-04-05 MED ORDER — TRAMADOL HCL 50 MG PO TABS: 50.0000 mg | ORAL_TABLET | Freq: Three times a day (TID) | ORAL | 0 refills | Status: DC | PRN

## 2020-04-05 MED FILL — traMADol HCL 50 MG TABS: 50 | 3 days supply | Qty: 21 | Fill #0

## 2020-04-05 NOTE — Patient Instructions (Signed)
 Platelet-rich plasma is used in musculoskeletal medicine to focus your own body's ability to heal. It has several well-done published randomized control trials (RCT) which demonstrate both its effectiveness and safety in many musculoskeletal conditions, including osteoarthritis, tendinopathies, and damaged vertebral discs. PRP has been in clinical use since the 1990's. Many people know that platelets form a clot if there is a cut in the skin. It turns out that platelets do not only form a clot, they also start the body's own repair process. When platelets activate to form a clot, they also release alpha granules which have hundreds of chemical messengers in them that initiate and organize repair to the damaged tissue. Precisely placing PRP into the site of injury will initiate the healing process by activating on the damaged cartilage or tendon. This is an inflammatory process, and inflammation is the vital first phase of Healing.  What to expect and how to prepare for PRP  . 2 weeks prior to the procedure: depending on the procedure, you may need to arrange for a driver to bring you home. IF you are having a lower extremity procedure, we can provide crutches as needed.  . 7 days prior to the procedure: Stop taking anti-inflammatory drugs like ibuprofen, Naprosyn, Celebrex, or Meloxicam. Let your doctor know if you have been taking prednisone or other corticosteroids in the last month.  . The day before the procedure: thoroughly shower and clean your skin.   . The day of the procedure: Wear loose-fitting clothing like sweatpants or shorts. If you are having an upper body procedure wear a top that can button or zip up.  PRP will initiate healing and a productive inflammation, and PRP therapy will make the body part treated sore for 4 days to two weeks. Anti-inflammatory drugs (i.e. ibuprofen, Naprosyn, Celebrex) and corticosteroids such as prednisone can blunt or stop this process, so  it is important to not take any anti-inflammatory drugs for 7 days before getting PRP therapy, or for at least three weeks after PRP therapy. Corticosteroid injections can blunt inflammation for 30 days, so let us know if you have had one recently. Depending on the body part injected, you may be in a sling or on crutches for several days. Just like wringing out a wet dishcloth, if you load or tense a tendon or ligament that has just been injected with PRP, some of the PRP injected will squish out. By keeping the body part treated relaxed by using a sling (for the shoulder or arm) or crutches (for hips and legs) for a few days, the PRP can bind in place and do its job.   You may need a driver to bring you home.  Tobacco?nicotine is a potent toxin and its use constricts small blood vessels which are needed for tissue repair.  Tobacco/nicotine use will limit the effectiveness of any treatment and stopping tobacco use is one of the single  greatest actions you can take to improve your health. Avoid toxins like alcohol, which inhibits and depresses the cells needed for tissue repair.  What happens during the PRP procedure?  Platelet rich plasma is made by taking some of your blood and performing a two-stage centrifuge process on it to concentrate the PRP. First, your blood is drawn into a syringe with a small amount of anti-coagulant in it (this is to keep the blood from clotting during this process). The amount of blood drawn is usually about 30-60 milliliters, depending on how much PRP is needed for   the treatment.  (There are 355 milliliters in a 12-ounce soda can for comparison).  Then the blood is transferred in a sterile fashion into a centrifuge tube. It is then centrifuged for the first cycle where the red blood cells are isolated and discarded. In the second centrifuge cycle, the platelet-rich fraction of the remaining plasma is concentrated and placed in a syringe. The skin at the  injection site is numbed with a small amount of topical cooling spray. Dr Michah Minton will then precisely inject the PRP into the injury site using ultrasound guidance.  What to do after your procedure  I will give you specific medicine to control any discomfort you may have after the procedure. Avoid NSAIDs like ibuprofen. Acetaminophen can be used for mild pain.  Depending on the part of the body treated, usually you will be placed in a sling or on crutches for 1 to 3 days. Do your best not to tense or load the treated area during this time. After 3 days, unless otherwise instructed, the treated body part should be used and slowly moved through its full range of motion. It will be sore, but you will not be doing damage by moving it, in fact it needs to move to heal. If you were on crutches for a period of time, walking is ok once you are off the crutches. For now, avoid activities that specifically hurt you before being treated. Exercise is vital to good health and finding a way to cross train around your injury is important not only for your physical health, but for your mental health as well. Ask me about cross training options for your injury. Some brief (10 minutes or less) period of heat or ice therapy will not hurt the therapy, but it is not required. Usually, depending on the initial injury, physical therapy is started from two weeks to four weeks after injection. Improvements in pain and function should be expected from 8 weeks to 12 weeks after injection and some injuries may require more than one treatment.    ___________________________________________ Alveta Quintela J. Derrel Moore, M.D., ABFM., CAQSM. Primary Care and Sports Medicine [facility]  Adjunct Professor of Family Medicine  [employer]   

## 2020-04-05 NOTE — Progress Notes (Signed)
    Procedures performed today:    Procedure: Real-time Ultrasound Guided injection of the right common extensor tendon origin Device: Samsung HS60  Verbal informed consent obtained.  Time-out conducted.  Noted no overlying erythema, induration, or other signs of local infection.  Skin prepped in a sterile fashion.  Local anesthesia: Topical Ethyl chloride.  With sterile technique and under real time ultrasound guidance: 1 cc Kenalog 40, 1 cc lidocaine, 1 cc bupivacaine injected easily Completed without difficulty  Pain immediately resolved suggesting accurate placement of the medication.  Advised to call if fevers/chills, erythema, induration, drainage, or persistent bleeding.  Images permanently stored and available for review in the ultrasound unit.  Impression: Technically successful ultrasound guided injection.  Independent interpretation of notes and tests performed by another provider:   None.  Brief History, Exam, Impression, and Recommendations:    Lateral epicondylitis, right elbow Jan returns, she is a pleasant 59 year old female with chronic right lateral epicondylitis, injected last year in the summertime. She is having recurrence of pain, and really admits to only about 3 months of good relief. We discussed the additional treatment options including PRP injections. She will look into it, I am going to give her some information. Today I performed a regular tennis elbow injection with steroid, I like to see her back on an as-needed basis, she understands that if she is going to do PRP needs to be at least 1 month from now and she needs to be 1 week off of NSAIDs.    ___________________________________________ Gwen Her. Dianah Field, M.D., ABFM., CAQSM. Primary Care and [place] Instructor of [place] of [facility] of Medicine

## 2020-04-05 NOTE — Assessment & Plan Note (Signed)
Jan returns, she is a pleasant 59 year old female with chronic right lateral epicondylitis, injected last year in the summertime. She is having recurrence of pain, and really admits to only about 3 months of good relief. We discussed the additional treatment options including PRP injections. She will look into it, I am going to give her some information. Today I performed a regular tennis elbow injection with steroid, I like to see her back on an as-needed basis, she understands that if she is going to do PRP needs to be at least 1 month from now and she needs to be 1 week off of NSAIDs.

## 2020-05-03 ENCOUNTER — Encounter: Payer: Self-pay | Admitting: Sports Medicine

## 2020-05-03 ENCOUNTER — Ambulatory Visit (INDEPENDENT_AMBULATORY_CARE_PROVIDER_SITE_OTHER): Payer: 59 | Admitting: Sports Medicine

## 2020-05-03 DIAGNOSIS — M7711 Lateral epicondylitis, right elbow: Secondary | ICD-10-CM | POA: Diagnosis not present

## 2020-05-03 NOTE — Assessment & Plan Note (Signed)
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 We did discuss PRP, I certainly think this should be the next option should this recur, home rehab exercises printed out again, return as needed.

## 2020-05-03 NOTE — Progress Notes (Signed)
° ° °  Procedures performed today:    None.  Independent interpretation of notes and tests performed by another provider:   None.  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 We did discuss PRP, I certainly think this should be the next option should this recur, home rehab exercises printed out again, return as needed.    ___________________________________________ Gwen Her. Dianah Field, M.D., ABFM., CAQSM. Primary Care and [place] Instructor of [place] of [facility] of Medicine

## 2020-05-05 ENCOUNTER — Ambulatory Visit: Payer: 59 | Admitting: Obstetrics & Gynecology

## 2020-05-17 ENCOUNTER — Other Ambulatory Visit: Payer: Self-pay | Admitting: *Deleted

## 2020-05-17 MED ORDER — NONFORMULARY OR COMPOUNDED ITEM: 1 refills | Status: DC

## 2020-05-17 NOTE — Telephone Encounter (Signed)
Medication refill request: estradiol/testosterone  Last AEX:  01-19-20 SM  Next AEX: 04-21-21 Last MMG (if hormonal medication request): 10-07-2019 density D/BIRADS 1 negative  Refill authorized: Today, please advise.   Medication pended for #1,1RF. Please refill if appropriate.

## 2020-05-25 DIAGNOSIS — H5213 Myopia, bilateral: Secondary | ICD-10-CM | POA: Diagnosis not present

## 2020-05-31 MED FILL — GABAPENTIN 300 MG CAPSULE: 300 | 45 days supply | Qty: 90 | Fill #1

## 2020-08-15 MED FILL — GABAPENTIN 300 MG CAPSULE: 300 | 45 days supply | Qty: 90 | Fill #2

## 2020-09-01 ENCOUNTER — Telehealth: Payer: Self-pay

## 2020-09-01 NOTE — Telephone Encounter (Signed)
Patient left VM to get more information about PRP.    Attempted to call patient; VM full.

## 2020-09-01 NOTE — Telephone Encounter (Signed)
Spoke with patient, she is aware that the PRP procedure is done all in one day, there is no need for 2 separate appt.  She understands that insurance does not cover this procedure and that payment may be required prior to procedure.    Are there any special instructions prior to PRP? I.e. no NSAIDs

## 2020-09-02 NOTE — Telephone Encounter (Signed)
I will go ahead and send her a MyChart message with my PRP template.  Might be good to check it out yourself as well to get an understanding of what is involved and anticipatory guidance.

## 2020-09-26 DIAGNOSIS — Z20822 Contact with and (suspected) exposure to covid-19: Secondary | ICD-10-CM | POA: Diagnosis not present

## 2020-09-27 MED FILL — GABAPENTIN 300 MG CAPSULE: 300 | 45 days supply | Qty: 90 | Fill #3

## 2020-11-02 ENCOUNTER — Other Ambulatory Visit: Payer: Self-pay | Admitting: Sports Medicine

## 2020-11-02 ENCOUNTER — Ambulatory Visit (INDEPENDENT_AMBULATORY_CARE_PROVIDER_SITE_OTHER): Payer: 59

## 2020-11-02 ENCOUNTER — Ambulatory Visit (INDEPENDENT_AMBULATORY_CARE_PROVIDER_SITE_OTHER): Payer: 59 | Admitting: Sports Medicine

## 2020-11-02 DIAGNOSIS — M7711 Lateral epicondylitis, right elbow: Secondary | ICD-10-CM

## 2020-11-02 MED ORDER — ONDANSETRON 8 MG PO TBDP
8.0000 mg | ORAL_TABLET | Freq: Three times a day (TID) | ORAL | 3 refills | Status: DC | PRN
Start: 2020-11-02 — End: 2020-11-02

## 2020-11-02 MED ORDER — HYDROCODONE-ACETAMINOPHEN 10-325 MG PO TABS: 1.0000 | ORAL_TABLET | Freq: Three times a day (TID) | ORAL | 0 refills | Status: DC | PRN

## 2020-11-02 MED FILL — HYDROCODON-APAP 10-325: 10-325 | 5 days supply | Qty: 15 | Fill #0

## 2020-11-02 MED FILL — ONDANSETRON ODT 8 MG TABLET: 8 | 6 days supply | Qty: 20 | Fill #0

## 2020-11-02 NOTE — Assessment & Plan Note (Signed)
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 Unfortunately she is having a recurrence of pain so we did proceed today with PRP percutaneous tenotomy of the right common extensor tendon origin, adding high-dose hydrocodone for postoperative pain, return to see me in 1 month.

## 2020-11-02 NOTE — Patient Instructions (Signed)
? ?Platelet-rich plasma is used in musculoskeletal medicine to focus your own body?s ability to ?heal. It has several well-done published randomized control trials (RCT) which demonstrate both ?its effectiveness and safety in many musculoskeletal conditions, including osteoarthritis, ?tendinopathies, and damaged vertebral discs. PRP has been in clinical use since the 1990?s. ?Many people know that platelets form a clot if there is a cut in the skin. It turns out that ?platelets do not only form a clot, they also start the body?s own repair process. When platelets ?activate to form a clot, they also release alpha granules which have hundreds of chemical ?messengers in them that initiate and organize repair to the damaged tissue. Precisely placing ?PRP into the site of injury will initiate the healing process by activating on the damaged ?cartilage or tendon. This is an inflammatory process, and inflammation is the vital first phase of ?Healing. ? ?What to expect and how to prepare for PRP ? ? 2 weeks prior to the procedure: depending on the procedure, you may need to arrange ?for a driver to bring you home. IF you are having a lower extremity procedure, we can provide crutches ?as needed. ? ? 7 days prior to the procedure: Stop taking anti-inflammatory drugs like ibuprofen, ?Naprosyn, Celebrex, or Meloxicam. Let Dr. Rennae Ferraiolo know if you have been taking prednisone or other ?corticosteroids in the last month. ? ? The day before the procedure: thoroughly shower and clean your skin.  ? ? The day of the procedure: Wear loose-fitting clothing like sweatpants or shorts. If you ?are having an upper body procedure wear a top that can button or zip up. ? ?PRP will initiate healing and a productive inflammation, and PRP therapy will make the body ?part treated sore for 4 days to two weeks. Anti-inflammatory drugs (i.e. ibuprofen, Naprosyn, ?Celebrex) and corticosteroids such as prednisone can blunt or stop this process,  so it is ?important to not take any anti-inflammatory drugs for 7 days before getting PRP therapy, or for ?at least three weeks after PRP therapy. Corticosteroid injections can blunt inflammation for 30 ?days, so let us know if you have had one recently. Depending on the body part injected, you ?may be in a sling or on crutches for several days. Just like wringing out a wet dishcloth, if you ?load or tense a tendon or ligament that has just been injected with PRP, some of the PRP ?injected will squish out. By keeping the body part treated relaxed by using a sling (for the ?shoulder or arm) or crutches (for hips and legs) for a few days, the PRP can bind in place and do ?its job.  ? ?You may need a driver to bring you home.  ?Tobacco/nicotine is a potent toxin and its use constricts small blood vessels which are needed for tissue repair.  ?Tobacco/nicotine use will limit the effectiveness of any treatment and stopping tobacco use is one of the single  ?greatest actions you can take to improve your health. Avoid toxins like alcohol, which inhibits and depresses the ?cells needed for tissue repair. ? ?What happens during the PRP procedure? ? ?Platelet rich plasma is made by taking some of your blood and performing a two-stage ?centrifuge process on it to concentrate the PRP. First, your blood is drawn into a syringe with a ?small amount of anti-coagulant in it (this is to keep the blood from clotting during this process). ?The amount of blood drawn is usually about 10-30 milliliters, depending on how much PRP is needed for   the treatment.  ?(There are 355 milliliters in a 12-ounce soda can for comparison).  ?Then the blood is transferred in a sterile fashion into a ?centrifuge tube. It is then centrifuged for the first cycle where the red blood cells are isolated ?and discarded. In the second centrifuge cycle, the platelet-rich fraction of the remaining plasma ?is concentrated and placed in a syringe. The skin at the  injection site is numbed with a small ?amount of topical cooling spray. Dr Itzia Cunliffe will then precisely inject the PRP ?into the injury site using ultrasound guidance. ? ?What to do after your procedure ? ?I will give you specific medicine to control any discomfort you may have after the procedure. ?Avoid NSAIDs like ibuprofen. ?Acetaminophen can be used for mild pain.  ?Depending on the part of the body ?treated, usually you will be placed in a sling or on crutches for 1 to 3 days. Do your best not to ?tense or load the treated area during this time. After 3 days, unless otherwise instructed, the ?treated body part should be used and slowly moved through its full range of motion. It will be ?sore, but you will not be doing damage by moving it, in fact it needs to move to heal. If you ?were on crutches for a period of time, walking is ok once you are off the crutches. For now, ?avoid activities that specifically hurt you before being treated. Exercise is vital to good health ?and finding a way to cross train around your injury is important not only for your physical ?health, but for your mental health as well. Ask me about cross training options for your injury. ?Some brief (10 minutes or less) period of heat or ice therapy will not hurt the therapy, but it is ?not required. Usually, depending on the initial injury, physical therapy is started from two ?weeks to four weeks after injection. Improvements in pain and function should be expected ?from 8 weeks to 12 weeks after injection and some injuries may require more than one ?treatment.  ? ? ?___________________________________________ ?Amanda Evans, M.D., ABFM., CAQSM. ?Primary Care and Sports Medicine ?[facility] ? ?Adjunct Professor of Family Medicine  ?[employer] ? ?

## 2020-11-02 NOTE — Progress Notes (Signed)
    Procedures performed today:    Procedure: Real-time Ultrasound Guided Platelet Rich Plasma (PRP) Injection of right common extensor tendon origin Device: Samsung HS60  Verbal informed consent obtained.  Time-out conducted.  Noted no overlying erythema, induration, or other signs of local infection.  Obtained 30 cc of blood from peripheral vein, using the "PEAK" centrifuge, red blood cells were separated from the plasma. Subsequently red blood cells were drained leaving only plasma with the buffy coat layer between the desired lines. Platelet poor plasma was then centrifuged out, and remaining platelet rich plasma aspirated into a 5 cc syringe.  Skin prepped in a sterile fashion.  Local anesthesia: Topical Ethyl chloride.  With sterile technique and under real time ultrasound guidance the platelet rich plasma (PRP) obtained above: Using a 22-gauge needle I injected 3 cc lidocaine, 3 cc bupivacaine into and around the common extensor tendon origin, we switched to the PRP syringe and making approximately 50 passes through the common extensor tendon at its origin at the lateral epicondyle we injected all 3 mL of PRP. Completed without difficulty  Advised to call if fevers/chills, erythema, induration, drainage, or persistent bleeding.  Images permanently stored and available for review in PACS.  Impression: Technically successful ultrasound guided Platelet Rich Plasma (PRP) injection.  Independent interpretation of notes and tests performed by another provider:   None.  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 Unfortunately she is having a recurrence of pain so we did proceed today with PRP percutaneous tenotomy of the right common extensor tendon origin, adding  high-dose hydrocodone for postoperative pain, return to see me in 1 month.    ___________________________________________ Ihor Austin. Benjamin Stain, M.D., ABFM., CAQSM. Primary Care and Sports Medicine [facility]  Adjunct Instructor of Family Medicine  [employer]

## 2020-11-03 ENCOUNTER — Telehealth: Payer: Self-pay

## 2020-11-03 NOTE — Telephone Encounter (Signed)
Patient reported that the elastic wrap was very tight and her hand was turning purple so she removed it. She asked when she could start using her arm again, 3 days? Please advise.

## 2020-11-03 NOTE — Telephone Encounter (Signed)
Patient aware of recommendations.

## 2020-11-03 NOTE — Telephone Encounter (Signed)
As she and I discussed in the office visit she will need to take it easy for a few days but activities as tolerated. She can use her hand when the pain improves enough to do what she wants to do.

## 2020-11-23 ENCOUNTER — Other Ambulatory Visit: Payer: Self-pay | Admitting: Obstetrics & Gynecology

## 2020-11-23 DIAGNOSIS — Z1231 Encounter for screening mammogram for malignant neoplasm of breast: Secondary | ICD-10-CM

## 2020-11-25 ENCOUNTER — Other Ambulatory Visit: Payer: Self-pay

## 2020-11-25 ENCOUNTER — Ambulatory Visit
Admission: RE | Admit: 2020-11-25 | Discharge: 2020-11-25 | Disposition: A | Discharge status: Home / Self Care | Payer: 59 | Source: Ambulatory Visit

## 2020-11-25 DIAGNOSIS — Z1231 Encounter for screening mammogram for malignant neoplasm of breast: Secondary | ICD-10-CM

## 2020-11-25 LAB — HM MAMMOGRAPHY

## 2020-11-30 ENCOUNTER — Ambulatory Visit (INDEPENDENT_AMBULATORY_CARE_PROVIDER_SITE_OTHER): Payer: 59 | Admitting: Sports Medicine

## 2020-11-30 ENCOUNTER — Other Ambulatory Visit: Payer: Self-pay

## 2020-11-30 DIAGNOSIS — M7711 Lateral epicondylitis, right elbow: Secondary | ICD-10-CM

## 2020-11-30 NOTE — Progress Notes (Signed)
    Procedures performed today:    None.  Independent interpretation of notes and tests performed by another provider:   None.  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 At this point her pain is now less than before the procedure, she will do her home rehab exercises, and she can return to see me in 2 months.     ___________________________________________ Gwen Her. Dianah Field, M.D., ABFM., CAQSM. Primary Care and [place] Instructor of [place] of [facility] of Medicine

## 2020-11-30 NOTE — Assessment & Plan Note (Signed)
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 At this point her pain is now less than before the procedure, she will do her home rehab exercises, and she can return to see me in 2 months.

## 2020-12-06 MED FILL — GABAPENTIN 300 MG CAPSULE: 300 | 45 days supply | Qty: 90 | Fill #4

## 2021-01-20 ENCOUNTER — Encounter (HOSPITAL_BASED_OUTPATIENT_CLINIC_OR_DEPARTMENT_OTHER): Payer: Self-pay

## 2021-01-20 ENCOUNTER — Other Ambulatory Visit (HOSPITAL_BASED_OUTPATIENT_CLINIC_OR_DEPARTMENT_OTHER): Payer: Self-pay | Admitting: Obstetrics & Gynecology

## 2021-01-20 ENCOUNTER — Other Ambulatory Visit (HOSPITAL_BASED_OUTPATIENT_CLINIC_OR_DEPARTMENT_OTHER): Payer: Self-pay

## 2021-01-20 MED ORDER — GABAPENTIN 300 MG PO CAPS
300.0000 mg | ORAL_CAPSULE | Freq: Every day | ORAL | 1 refills | Status: DC
  Filled 2021-01-20: qty 90, 45d supply, fill #0
  Filled 2021-03-06: qty 90, 45d supply, fill #1

## 2021-01-27 ENCOUNTER — Ambulatory Visit: Payer: 59 | Admitting: Sports Medicine

## 2021-01-27 ENCOUNTER — Other Ambulatory Visit: Payer: Self-pay

## 2021-01-27 DIAGNOSIS — M7711 Lateral epicondylitis, right elbow: Secondary | ICD-10-CM | POA: Diagnosis not present

## 2021-01-27 NOTE — Progress Notes (Signed)
° ° °  Procedures performed today:    None.  Independent interpretation of notes and tests performed by another provider:   None.  Brief History, Exam, Impression, and Recommendations:    Lateral epicondylitis, right elbow This is a very pleasant 60 year old female, 3 months ago I did a PRP percutaneous tenotomy in her common extensor tendon origin on the right. She has improved considerably, she has no pain referable to her common extensor tendon. She has a bit of pain over the anconeus muscle likely related to osteoarthritis, but otherwise she feels like she can live with it at this juncture, no further intervention needed. Return as needed.    ___________________________________________ Gwen Her. Dianah Field, M.D., ABFM., CAQSM. Primary Care and [place] Instructor of [place] of [facility] of Medicine

## 2021-01-27 NOTE — Assessment & Plan Note (Signed)
This is a very pleasant 60 year old female, 3 months ago I did a PRP percutaneous tenotomy in her common extensor tendon origin on the right. She has improved considerably, she has no pain referable to her common extensor tendon. She has a bit of pain over the anconeus muscle likely related to osteoarthritis, but otherwise she feels like she can live with it at this juncture, no further intervention needed. Return as needed.

## 2021-02-16 ENCOUNTER — Other Ambulatory Visit (HOSPITAL_BASED_OUTPATIENT_CLINIC_OR_DEPARTMENT_OTHER): Payer: Self-pay | Admitting: *Deleted

## 2021-02-16 MED ORDER — NONFORMULARY OR COMPOUNDED ITEM: 1 refills | Status: DC

## 2021-02-16 NOTE — Telephone Encounter (Signed)
Pt called for refill on estrogen/testosterone cream. Dr Sabra Heck to sign and [facility]. Pt informed pt has AEX  04/06/21 KW CMA

## 2021-03-06 ENCOUNTER — Other Ambulatory Visit (HOSPITAL_BASED_OUTPATIENT_CLINIC_OR_DEPARTMENT_OTHER): Payer: Self-pay

## 2021-03-09 ENCOUNTER — Encounter (HOSPITAL_BASED_OUTPATIENT_CLINIC_OR_DEPARTMENT_OTHER): Payer: Self-pay

## 2021-03-13 ENCOUNTER — Other Ambulatory Visit: Payer: Self-pay

## 2021-03-13 ENCOUNTER — Encounter (HOSPITAL_COMMUNITY): Payer: Self-pay

## 2021-03-13 ENCOUNTER — Ambulatory Visit (HOSPITAL_COMMUNITY)
Admission: EM | Admit: 2021-03-13 | Discharge: 2021-03-13 | Disposition: A | Discharge status: Home / Self Care | Payer: 59 | Attending: Family Medicine | Admitting: Family Medicine

## 2021-03-13 DIAGNOSIS — R079 Chest pain, unspecified: Secondary | ICD-10-CM

## 2021-03-13 NOTE — Discharge Instructions (Signed)
You have been seen at the  Urgent Care today for chest pain. Your evaluation today was not suggestive of any emergent condition requiring medical intervention at this time. Your ECG (heart tracing) did not show any worrisome changes. However, some medical problems make take more time to appear. Therefore, it's very important that you pay attention to any new symptoms or worsening of your current condition.  Please proceed directly to the Emergency Department immediately should you feel worse in any way or have any of the following symptoms: increasing or different chest pain, pain that spreads to your arm, neck, jaw, back or abdomen, shortness of breath, or nausea and vomiting.  

## 2021-03-13 NOTE — ED Triage Notes (Signed)
Pt c/o chest pain x this morning. She states the pain is on the left side and states she has been nauseas yesterday. Pt reports she has been having stress and headaches. Pt denies SOB and arm pain. Pt states the pain comes and goes.

## 2021-03-15 NOTE — ED Provider Notes (Signed)
[place]   [id number] 03/13/21 Arrival Time: 1214  ASSESSMENT & PLAN:  1. Chest pain, unspecified type     Patient history and exam suggest low risk of cardiac pain but discussed that I am not able to check Troponin here. Pain has resolved. No current symptoms.  ECG: Performed today and interpreted by me: normal EKG, normal sinus rhythm. No STEMI.  She is comfortable with home observation. CP precautions given.   Discharge Instructions     You have been seen at the [facility] today for chest pain. Your evaluation today was not suggestive of any emergent condition requiring medical intervention at this time. Your ECG (heart tracing) did not show any worrisome changes. However, some medical problems make take more time to appear. Therefore, it's very important that you pay attention to any new symptoms or worsening of your current condition.  Please proceed directly to the Emergency Department immediately should you feel worse in any way or have any of the following symptoms: increasing or different chest pain, pain that spreads to your arm, neck, jaw, back or abdomen, shortness of breath, or nausea and vomiting.       Follow-up Information    [facility].   Specialty: Emergency Medicine Why: If symptoms worsen in any way. Contact information: [address] [id number] [place] [phone]              Reviewed expectations re: course of current medical issues. Questions answered. Outlined signs and symptoms indicating need for more acute intervention. Patient verbalized understanding. After Visit Summary given.   SUBJECTIVE:  History from: patient. Amanda Evans is a 60 y.o. female who presents with complaint of an episode of mid-sternal, slightly left, non-radiating chest pain described as dull. Noted this morning; lasted several minutes then improved. On hindsight  this happened after she had moved a heavy tomato plant container. Without associated n/v/SOB/diaphoresis. No chest trauma Denies: fatigue, irregular heart beat, lower extremity edema, near-syncope, orthopnea, palpitations, paroxysmal nocturnal dyspnea and syncope. No questions if certain movements produce similar pain but no pain with deep inspiration. Recent illnesses: none. Fever: absent. Ambulatory without assistance. Self/OTC treatment: none PTA. History of similar: no. Illicit drug use: denies.  Social History   Tobacco Use  Smoking Status Never Smoker  Smokeless Tobacco Never Used   Social History   Substance and Sexual Activity  Alcohol Use Yes  . Alcohol/week: 0.0 - 1.0 standard drinks  .  OBJECTIVE:  Vitals:   03/13/21 1222  BP: 121/72  Pulse: 70  Resp: 16  Temp: 98.1 F (36.7 C)  TempSrc: Oral  SpO2: 100%    General appearance: alert, oriented, no acute distress Eyes: PERRLA; EOMI; conjunctivae normal HENT: normocephalic; atraumatic Neck: supple with FROM Lungs: without labored respirations; speaks full sentences without difficulty; CTAB Heart: regular rate and rhythm without murmer Chest Wall: with mild tenderness to palpation over left superior sternal edge Abdomen: soft, non-tender; no guarding or rebound tenderness Extremities: without edema; without calf swelling or tenderness; symmetrical without gross deformities Skin: warm and dry; without rash or lesions Neuro: normal gait Psychological: alert and cooperative; normal mood and affect   Allergies  Allergen Reactions  . Naproxen Sodium     REACTION: hives  . Penicillins     REACTION: swelling    Past Medical History:  Diagnosis Date  . HLD (hyperlipidemia)   . STD (sexually transmitted disease)    HSV  .  Urinary incontinence    Social History   Socioeconomic History  . Marital status: Married    Spouse name: Not on file  . Number of children: 1  . Years of education: Not on file   . Highest education level: Not on file  Occupational History  . Occupation: GRAPHIC DESIGNER    Employer: [employer]  Tobacco Use  . Smoking status: Never Smoker  . Smokeless tobacco: Never Used  Vaping Use  . Vaping Use: Never used  Substance and Sexual Activity  . Alcohol use: Yes    Alcohol/week: 0.0 - 1.0 standard drinks  . Drug use: No  . Sexual activity: Yes    Comment: vasectomy  Other Topics Concern  . Not on file  Social History Narrative  . Not on file   Social Determinants of Health   Financial Resource Strain: Not on file  Food Insecurity: Not on file  Transportation Needs: Not on file  Physical Activity: Not on file  Stress: Not on file  Social Connections: Not on file  Intimate Partner Violence: Not on file   Family History  Problem Relation Age of Onset  . Hyperlipidemia Mother   . Thyroid disease Mother   . Osteoporosis Mother   . Hyperlipidemia Father   . Heart attack Father   . Heart disease Father   . Transient ischemic attack Father   . Prostate cancer Father        dx. 31-71  . Melanoma Father        dx. >73  . Colon cancer Sister        dx. 58-59  . Breast cancer Maternal Grandmother        dx. 10 or younger  . Heart disease Paternal Grandmother   . Heart disease Paternal Grandfather   . Breast cancer Sister 16       mastectomy  . Brain cancer Maternal Uncle        unspecified type  . Cirrhosis Maternal Uncle        +EtOH  . Heart Problems Paternal Uncle   . Heart Problems Maternal Grandfather   . Testicular cancer Maternal Uncle        dx. early 21s  . Lung cancer Cousin        d. late 24s; smoker  . Testicular cancer Cousin 6  . Melanoma Cousin 18  . Sudden death Neg Hx   . Hypertension Neg Hx   . Diabetes Neg Hx    Past Surgical History:  Procedure Laterality Date  . BREAST EXCISIONAL BIOPSY Right   . CESAREAN SECTION    . NOVASURE ABLATION  10/06/2010   in office, had vasovagal response     Vanessa Kick,  MD 03/15/21 1046

## 2021-04-06 ENCOUNTER — Encounter (HOSPITAL_BASED_OUTPATIENT_CLINIC_OR_DEPARTMENT_OTHER): Payer: Self-pay | Admitting: Obstetrics & Gynecology

## 2021-04-06 ENCOUNTER — Ambulatory Visit (INDEPENDENT_AMBULATORY_CARE_PROVIDER_SITE_OTHER): Payer: 59 | Admitting: Obstetrics & Gynecology

## 2021-04-06 ENCOUNTER — Other Ambulatory Visit (HOSPITAL_BASED_OUTPATIENT_CLINIC_OR_DEPARTMENT_OTHER): Payer: Self-pay

## 2021-04-06 ENCOUNTER — Other Ambulatory Visit: Payer: Self-pay

## 2021-04-06 VITALS — BP 128/79 | HR 77 | Ht 63.75 in | Wt 114.0 lb

## 2021-04-06 DIAGNOSIS — N390 Urinary tract infection, site not specified: Secondary | ICD-10-CM | POA: Diagnosis not present

## 2021-04-06 DIAGNOSIS — Z8249 Family history of ischemic heart disease and other diseases of the circulatory system: Secondary | ICD-10-CM

## 2021-04-06 DIAGNOSIS — Z803 Family history of malignant neoplasm of breast: Secondary | ICD-10-CM

## 2021-04-06 DIAGNOSIS — Z01419 Encounter for gynecological examination (general) (routine) without abnormal findings: Secondary | ICD-10-CM | POA: Diagnosis not present

## 2021-04-06 DIAGNOSIS — N951 Menopausal and female climacteric states: Secondary | ICD-10-CM

## 2021-04-06 DIAGNOSIS — Z780 Asymptomatic menopausal state: Secondary | ICD-10-CM

## 2021-04-06 DIAGNOSIS — E7800 Pure hypercholesterolemia, unspecified: Secondary | ICD-10-CM

## 2021-04-06 DIAGNOSIS — Z800 Family history of malignant neoplasm of digestive organs: Secondary | ICD-10-CM | POA: Diagnosis not present

## 2021-04-06 MED ORDER — ESTRADIOL 0.1 MG/GM VA CREA
TOPICAL_CREAM | VAGINAL | 3 refills | Status: DC
  Filled 2021-04-06: qty 42.5, 30d supply, fill #0

## 2021-04-06 MED ORDER — GABAPENTIN 100 MG PO CAPS
ORAL_CAPSULE | ORAL | 1 refills | Status: DC
  Filled 2021-04-06: qty 60, 30d supply, fill #0

## 2021-04-06 MED ORDER — GABAPENTIN 300 MG PO CAPS
ORAL_CAPSULE | ORAL | 3 refills | Status: DC
  Filled 2021-04-06 – 2021-04-20 (×2): qty 180, 90d supply, fill #0
  Filled 2021-07-31: qty 180, 90d supply, fill #1
  Filled 2021-10-30: qty 180, 90d supply, fill #2
  Filled 2022-01-28: qty 180, 90d supply, fill #3

## 2021-04-06 MED ORDER — NITROFURANTOIN MONOHYD MACRO 100 MG PO CAPS
100.0000 mg | ORAL_CAPSULE | ORAL | 0 refills | Status: DC | PRN
  Filled 2021-04-06: qty 30, 30d supply, fill #0

## 2021-04-06 NOTE — Progress Notes (Signed)
60 y.o. G65P1011 Married White or Caucasian female here for annual exam.  Doing well.  Still having hot flashes.  Was seen at Urgent Care in early May due to some chest pain.  This occurred after lifting a pot.  She does have a lot of concern about cardiovascular disease based on her family history.  D/w pt having cardiology consultation and discussion of possible coronary CT, if appropriate.  Both her parents lives her in [place].  Father has MI in his 63's and has undergone 2 CABG surgeries.   No LMP recorded. Patient has had an ablation.          Sexually active: Yes.    The current method of family planning is post menopausal status.    Exercising: Yes.    as much as she can Smoker:  no  Health Maintenance: Pap:  12/22/2018 ASCUS with neg HR HPV History of abnormal Pap:  no MMG:  11/25/20 Colonoscopy:  01/13/16.  Pt aware this is due.  BMD:   06/17/19 shows Osteopenia TDaP:  05/2013 Shingrix:   Discussed with pt today Hep C testing: 2016 Screening Labs: 12/2018   reports that she has never smoked. She has never used smokeless tobacco. She reports current alcohol use. She reports that she does not use drugs.  Past Medical History:  Diagnosis Date  . HLD (hyperlipidemia)   . STD (sexually transmitted disease)    HSV  . Urinary incontinence     Past Surgical History:  Procedure Laterality Date  . BREAST EXCISIONAL BIOPSY Right   . CESAREAN SECTION    . NOVASURE ABLATION  10/06/2010   in office, had vasovagal response    Current Outpatient Medications  Medication Sig Dispense Refill  . Ascorbic Acid (VITAMIN C PO) Take by mouth.    . Biotin 5000 MCG CAPS Take 1 capsule by mouth daily.    . Black Cohosh 40 MG CAPS Take by mouth.    . estradiol (ESTRACE) 0.1 MG/GM vaginal cream 1 gram vaginally twice weekly 42.5 g 3  . gabapentin (NEURONTIN) 100 MG capsule Take in am and 1 in early afternoon as needed for hot flash control. 60 capsule 1  . Ginkgo Biloba 100 MG CAPS Take 1 capsule by  mouth daily.    . Omega-3 Fatty Acids (FISH OIL) 1000 MG CAPS Take 1 capsule by mouth daily.    . TURMERIC PO Take by mouth daily.    . Calcium-Vitamin A-Vitamin D (LIQUID CALCIUM PO) Take 1,000 mg by mouth daily. (Patient not taking: Reported on 04/06/2021)    . gabapentin (NEURONTIN) 300 MG capsule Take 2 capsules nightly 180 capsule 3  . neomycin-polymyxin b-dexamethasone (MAXITROL) 3.5-10000-0.1 OINT as needed. (Patient not taking: Reported on 04/06/2021)    . nitrofurantoin, macrocrystal-monohydrate, (MACROBID) 100 MG capsule Take 1 capsule (100 mg total) by mouth as needed (after sexual intercourse; increase to twice x3 days with symptoms). 30 capsule 0  . ondansetron (ZOFRAN-ODT) 8 MG disintegrating tablet DISSOLVE 1 TABLET BY MOUTH EVERY 8 HOURS AS NEEDED FOR NAUSEA (Patient not taking: Reported on 04/06/2021) 20 tablet 3  . traMADol (ULTRAM) 50 MG tablet Take 1-2 tablets (50-100 mg total) by mouth every 8 (eight) hours as needed for moderate pain. Maximum 6 tabs per day. (Patient not taking: Reported on 04/06/2021) 21 tablet 0   No current facility-administered medications for this visit.    Family History  Problem Relation Age of Onset  . Hyperlipidemia Mother   . Thyroid disease Mother   .  Osteoporosis Mother   . Hyperlipidemia Father   . Heart attack Father   . Heart disease Father   . Transient ischemic attack Father   . Prostate cancer Father        dx. 73-71  . Melanoma Father        dx. >26  . Colon cancer Sister        dx. 58-59  . Breast cancer Maternal Grandmother        dx. 29 or younger  . Heart disease Paternal Grandmother   . Heart disease Paternal Grandfather   . Breast cancer Sister 35       mastectomy  . Brain cancer Maternal Uncle        unspecified type  . Cirrhosis Maternal Uncle        +EtOH  . Heart Problems Paternal Uncle   . Heart Problems Maternal Grandfather   . Testicular cancer Maternal Uncle        dx. early 74s  . Lung cancer Cousin        d.  late 66s; smoker  . Testicular cancer Cousin 34  . Melanoma Cousin 57  . Sudden death Neg Hx   . Hypertension Neg Hx   . Diabetes Neg Hx     Review of Systems  All other systems reviewed and are negative.   Exam:   BP 128/79   Pulse 77   Ht 5' 3.75" (1.619 m)   Wt 114 lb (51.7 kg)   BMI 19.72 kg/m   Height: 5' 3.75" (161.9 cm)  General appearance: alert, cooperative and appears stated age Head: Normocephalic, without obvious abnormality, atraumatic Neck: no adenopathy, supple, symmetrical, trachea midline and thyroid normal to inspection and palpation Lungs: clear to auscultation bilaterally Breasts: normal appearance, no masses or tenderness Heart: regular rate and rhythm Abdomen: soft, non-tender; bowel sounds normal; no masses,  no organomegaly Extremities: extremities normal, atraumatic, no cyanosis or edema Skin: Skin color, texture, turgor normal. No rashes or lesions Lymph nodes: Cervical, supraclavicular, and axillary nodes normal. No abnormal inguinal nodes palpated Neurologic: Grossly normal  Pelvic: External genitalia:  no lesions              Urethra:  normal appearing urethra with no masses, tenderness or lesions              Bartholins and Skenes: normal                 Vagina: normal appearing vagina with normal color and no discharge, no lesions              Cervix: no lesions              Pap taken: No. Bimanual Exam:  Uterus:  normal size, contour, position, consistency, mobility, non-tender              Adnexa: normal adnexa and no mass, fullness, tenderness               Rectovaginal: Confirms               Anus:  normal sphincter tone, no lesions  Chaperone, Octaviano Batty, CMA, was present for exam.  Assessment/Plan: 1. Well woman exam with routine gynecological exam - pap 2020 with ASCUS, neg HR HPV.  Follow up next year. - MMG 2022  2. Elevated LDL cholesterol level  3. Family history of cardiovascular disease - Ambulatory referral to  Cardiology  4. Family history of breast cancer in female -  in [place]  5. Family history of colon cancer - Pt aware this is due  6. Postmenopausal - estradiol (ESTRACE) 0.1 MG/GM vaginal cream; 1 gram vaginally twice weekly  Dispense: 42.5 g; Refill: 3  7. Hot flashes due to menopause - gabapentin (NEURONTIN) 300 MG capsule; Take 2 capsules nightly  Dispense: 180 capsule; Refill: 3 - gabapentin (NEURONTIN) 100 MG capsule; Take in am and 1 in early afternoon as needed for hot flash control.  Dispense: 60 capsule; Refill: 1  8. Recurrent UTI - nitrofurantoin, macrocrystal-monohydrate, (MACROBID) 100 MG capsule; Take 1 capsule (100 mg total) by mouth as needed (after sexual intercourse; increase to twice x3 days with symptoms).  Dispense: 30 capsule; Refill: 0

## 2021-04-06 NOTE — Patient Instructions (Signed)
[facility] on [facility]  Dr. Particia Nearing Dr. Derrel Nip

## 2021-04-20 ENCOUNTER — Other Ambulatory Visit (HOSPITAL_BASED_OUTPATIENT_CLINIC_OR_DEPARTMENT_OTHER): Payer: Self-pay

## 2021-04-21 ENCOUNTER — Ambulatory Visit: Payer: 59

## 2021-05-29 DIAGNOSIS — H5213 Myopia, bilateral: Secondary | ICD-10-CM | POA: Diagnosis not present

## 2021-06-19 NOTE — Progress Notes (Signed)
Cardiology Office Note   Date:  06/20/2021   ID:  Amanda Evans, DOB 18-Apr-1961, MRN [id number]  PCP:  Pcp, No  Cardiologist:   Dorris Carnes, MD   Patient presents for cardiac risk stratification   History of Present Illness: Amanda Evans is a 60 y.o. female with a history of chest pain   In May 2022 she had an episode of CP   Midsternal, L parasternal   Dull   Lasted a few min    Notes having moved something in garden the day prior   She went to ED   Labs negative  She was sent home   Has not had discomfort like that since    She did have one other spell.  WOke up with stabbing pain L parasternal   Lasted a min.   Pain worse with inspiration.   Again, none since    Activity level  Not as active as used to be   Now walks some   Notes no SOB Breathing is OK   Both parents have abdominal aortic aneurysms   Mother without CAD   has 4.4 cm    Mother is 72 Dad First event in his 65s    He has had 2 CABGs    Current Meds  Medication Sig   Ascorbic Acid (VITAMIN C PO) Take by mouth.   Biotin 5000 MCG CAPS Take 1 capsule by mouth daily.   Black Cohosh 40 MG CAPS Take by mouth.   estradiol (ESTRACE) 0.1 MG/GM vaginal cream 1 gram vaginally twice weekly   gabapentin (NEURONTIN) 100 MG capsule Take in am and 1 in early afternoon as needed for hot flash control.   gabapentin (NEURONTIN) 300 MG capsule Take 2 capsules nightly   Ginkgo Biloba 100 MG CAPS Take 1 capsule by mouth daily.   nitrofurantoin, macrocrystal-monohydrate, (MACROBID) 100 MG capsule Take 1 capsule (100 mg total) by mouth as needed (after sexual intercourse; increase to twice x3 days with symptoms).   Omega-3 Fatty Acids (FISH OIL) 1000 MG CAPS Take 1 capsule by mouth daily.   TURMERIC PO Take by mouth daily.     Allergies:   Naproxen sodium and Penicillins   Past Medical History:  Diagnosis Date   HLD (hyperlipidemia)    STD (sexually transmitted disease)    HSV   Urinary incontinence     Past Surgical  History:  Procedure Laterality Date   BREAST EXCISIONAL BIOPSY Right    CESAREAN SECTION     NOVASURE ABLATION  10/06/2010   in office, had vasovagal response     Social History:  The patient  reports that she has never smoked. She has never used smokeless tobacco. She reports current alcohol use. She reports that she does not use drugs.   Family History:  The patient's family history includes Brain cancer in her maternal uncle; Breast cancer in her maternal grandmother; Breast cancer (age of onset: 32) in her sister; Cirrhosis in her maternal uncle; Colon cancer in her sister; Heart Problems in her maternal grandfather and paternal uncle; Heart attack in her father; Heart disease in her father, paternal grandfather, and paternal grandmother; Hyperlipidemia in her father and mother; Lung cancer in her cousin; Melanoma in her father; Melanoma (age of onset: 50) in her cousin; Osteoporosis in her mother; Prostate cancer in her father; Testicular cancer in her maternal uncle; Testicular cancer (age of onset: 46) in her cousin; Thyroid disease in her mother; Transient ischemic attack in  her father.    ROS:  Please see the history of present illness. All other systems are reviewed and  Negative to the above problem except as noted.    PHYSICAL EXAM: VS:  BP 108/66   Pulse 91   Ht 5' 3.5" (1.613 m)   Wt 113 lb 9.6 oz (51.5 kg)   SpO2 98%   BMI 19.81 kg/m   GEN: Thn 60 yo  in no acute distress  HEENT: normal  Neck: no JVD, carotid bruits Cardiac: RRR; no murmurs, No LE edema  Respiratory:  clear to auscultation bilaterally,  GI: soft, nontender, nondistended, + BS  No hepatomegaly  MS: no deformity Moving all extremities   Skin: warm and dry, no rash Neuro:  Strength and sensation are intact Psych: euthymic mood, full affect   EKG:  EKG is ordered today.  SR    Lipid Panel    Component Value Date/Time   CHOL 223 (H) 01/20/2020 0904   TRIG 48 01/20/2020 0904   HDL 80 01/20/2020  0904   CHOLHDL 2.8 01/20/2020 0904   CHOLHDL 3.5 11/07/2016 1200   VLDL 27 11/07/2016 1200   LDLCALC 135 (H) 01/20/2020 0904      Wt Readings from Last 3 Encounters:  06/20/21 113 lb 9.6 oz (51.5 kg)  04/06/21 114 lb (51.7 kg)  04/05/20 115 lb (52.2 kg)      ASSESSMENT AND PLAN:  1  Chest pains   Aypical for cardiac Reviewed with pt     With FHX of CAD I would recomm a calcium score CT to define burden/presence of CAD  Further follow up based on this Aneurysmal changes of aorta in mom probably related to age   With CT will see aorta  2  Lipds   Will get fasting lipomed wit hLp(a) and  Apo B   Follow up based on test results   DIscussed diet (low carb, low sugar, TRE)  Current medicines are reviewed at length with the patient today.  The patient does not have concerns regarding medicines.  Signed, Dorris Carnes, MD  06/20/2021 10:55 AM    [address] [place], [place],   [address] Phone: [phone]; Fax: [phone]

## 2021-06-20 ENCOUNTER — Ambulatory Visit (INDEPENDENT_AMBULATORY_CARE_PROVIDER_SITE_OTHER)
Admission: RE | Admit: 2021-06-20 | Discharge: 2021-06-20 | Disposition: A | Discharge status: Home / Self Care | Payer: Self-pay | Source: Ambulatory Visit | Attending: Internal Medicine | Admitting: Internal Medicine

## 2021-06-20 ENCOUNTER — Other Ambulatory Visit: Payer: Self-pay

## 2021-06-20 ENCOUNTER — Encounter: Payer: Self-pay | Admitting: Internal Medicine

## 2021-06-20 ENCOUNTER — Ambulatory Visit: Payer: 59 | Admitting: Internal Medicine

## 2021-06-20 VITALS — BP 108/66 | HR 91 | Ht 63.5 in | Wt 113.6 lb

## 2021-06-20 DIAGNOSIS — Z8249 Family history of ischemic heart disease and other diseases of the circulatory system: Secondary | ICD-10-CM

## 2021-06-20 NOTE — Patient Instructions (Signed)
Medication Instructions:  No changes *If you need a refill on your cardiac medications before your next appointment, please call your pharmacy*   Lab Work: Today: NMR profile  If you have labs (blood work) drawn today and your tests are completely normal, you will receive your results only by: [facility] (if you have MyChart) OR A paper copy in the mail If you have any lab test that is abnormal or we need to change your treatment, we will call you to review the results.   Testing/Procedures: Calcium score CT    Follow-Up: Follow up with your physician will depend on test results.   Other Instructions

## 2021-06-21 ENCOUNTER — Other Ambulatory Visit: Payer: 59 | Admitting: *Deleted

## 2021-06-21 DIAGNOSIS — Z8249 Family history of ischemic heart disease and other diseases of the circulatory system: Secondary | ICD-10-CM | POA: Diagnosis not present

## 2021-06-21 DIAGNOSIS — Z7689 Persons encountering health services in other specified circumstances: Secondary | ICD-10-CM | POA: Diagnosis not present

## 2021-06-22 LAB — NMR, LIPOPROFILE
Cholesterol, Total: 234 mg/dL — ABNORMAL HIGH (ref 100–199)
HDL Particle Number: 37.6 umol/L (ref >=30.5)
HDL-C: 78 mg/dL (ref >39)
LDL Particle Number: 1359 nmol/L — ABNORMAL HIGH (ref <1000)
LDL Size: 21.8 nm (ref >20.5)
LDL-C (NIH Calc): 144 mg/dL — ABNORMAL HIGH (ref 0–99)
LP-IR Score: <25 (ref <=45)
Small LDL Particle Number: 210 nmol/L (ref <=527)
Triglycerides: 73 mg/dL (ref 0–149)

## 2021-06-22 LAB — LIPOPROTEIN A (LPA): Lipoprotein (a): 178.6 nmol/L — ABNORMAL HIGH (ref <75.0)

## 2021-06-22 LAB — APOLIPOPROTEIN B: Apolipoprotein B: 111 mg/dL — ABNORMAL HIGH (ref <90)

## 2021-07-14 ENCOUNTER — Telehealth: Payer: Self-pay | Admitting: *Deleted

## 2021-07-14 ENCOUNTER — Other Ambulatory Visit (HOSPITAL_BASED_OUTPATIENT_CLINIC_OR_DEPARTMENT_OTHER): Payer: Self-pay

## 2021-07-14 DIAGNOSIS — Z8249 Family history of ischemic heart disease and other diseases of the circulatory system: Secondary | ICD-10-CM

## 2021-07-14 DIAGNOSIS — E7841 Elevated Lipoprotein(a): Secondary | ICD-10-CM

## 2021-07-14 MED ORDER — ROSUVASTATIN CALCIUM 10 MG PO TABS
10.0000 mg | ORAL_TABLET | Freq: Every day | ORAL | 3 refills | Status: DC
  Filled 2021-07-14: qty 90, 90d supply, fill #0

## 2021-07-14 MED ORDER — ROSUVASTATIN CALCIUM 10 MG PO TABS
10.0000 mg | ORAL_TABLET | Freq: Every day | ORAL | 3 refills | Status: DC
  Filled 2021-07-14: qty 90, 90d supply, fill #0
  Filled 2021-10-12: qty 90, 90d supply, fill #1
  Filled 2022-01-10: qty 90, 90d supply, fill #2
  Filled 2022-04-17: qty 90, 90d supply, fill #3

## 2021-07-14 NOTE — Telephone Encounter (Signed)
Spoke w patient. Sent Crestor 10 mg to [facility] pharmacy Scheduled lab appointment for 11/8. Reviewed plan for diet.

## 2021-07-14 NOTE — Telephone Encounter (Signed)
-----   Message from [place], MD sent at 07/12/2021  5:38 PM EDT ----- Spoke to patient  REviewed labs    Lp(a) very elevated  Goes along with FHx of CAD Would recomm statin for tighter control of LDL for now WOuld start Crestor 10 mg Repeat lipomed panel and ApoB in 8 wks with LFT Discussed mediterrranean diet and low sugar

## 2021-07-31 ENCOUNTER — Other Ambulatory Visit (HOSPITAL_BASED_OUTPATIENT_CLINIC_OR_DEPARTMENT_OTHER): Payer: Self-pay

## 2021-08-11 ENCOUNTER — Other Ambulatory Visit (HOSPITAL_BASED_OUTPATIENT_CLINIC_OR_DEPARTMENT_OTHER): Payer: Self-pay

## 2021-08-11 MED ORDER — INFLUENZA VAC SPLIT QUAD 0.5 ML IM SUSY
PREFILLED_SYRINGE | INTRAMUSCULAR | 0 refills | Status: DC
  Filled 2021-08-11: qty 0.5, 1d supply, fill #0

## 2021-09-12 ENCOUNTER — Other Ambulatory Visit: Payer: Self-pay

## 2021-09-12 ENCOUNTER — Other Ambulatory Visit: Payer: 59 | Admitting: *Deleted

## 2021-09-12 DIAGNOSIS — Z8249 Family history of ischemic heart disease and other diseases of the circulatory system: Secondary | ICD-10-CM | POA: Diagnosis not present

## 2021-09-12 DIAGNOSIS — E7841 Elevated Lipoprotein(a): Secondary | ICD-10-CM

## 2021-09-15 ENCOUNTER — Ambulatory Visit: Payer: 59 | Admitting: Sports Medicine

## 2021-09-15 ENCOUNTER — Other Ambulatory Visit (HOSPITAL_BASED_OUTPATIENT_CLINIC_OR_DEPARTMENT_OTHER): Payer: Self-pay

## 2021-09-15 ENCOUNTER — Other Ambulatory Visit: Payer: Self-pay

## 2021-09-15 DIAGNOSIS — M5136 Other intervertebral disc degeneration, lumbar region with discogenic back pain only: Secondary | ICD-10-CM

## 2021-09-15 DIAGNOSIS — E785 Hyperlipidemia, unspecified: Secondary | ICD-10-CM | POA: Diagnosis not present

## 2021-09-15 LAB — HEPATIC FUNCTION PANEL
ALT: 22 IU/L (ref 0–32)
AST: 36 IU/L (ref 0–40)
Albumin: 5.2 g/dL — ABNORMAL HIGH (ref 3.8–4.9)
Alkaline Phosphatase: 62 IU/L (ref 44–121)
Bilirubin Total: 0.4 mg/dL (ref 0.0–1.2)
Bilirubin, Direct: 0.12 mg/dL (ref 0.00–0.40)
Total Protein: 7.1 g/dL (ref 6.0–8.5)

## 2021-09-15 LAB — NMR, LIPOPROFILE
Cholesterol, Total: 174 mg/dL (ref 100–199)
HDL Particle Number: 37.2 umol/L (ref >=30.5)
HDL-C: 82 mg/dL (ref >39)
LDL Particle Number: 736 nmol/L (ref <1000)
LDL Size: 21.1 nm (ref >20.5)
LDL-C (NIH Calc): 81 mg/dL (ref 0–99)
LP-IR Score: <25 (ref <=45)
Small LDL Particle Number: <90 nmol/L (ref <=527)
Triglycerides: 58 mg/dL (ref 0–149)

## 2021-09-15 LAB — APOLIPOPROTEIN B: Apolipoprotein B: 77 mg/dL (ref <90)

## 2021-09-15 LAB — LIPOPROTEIN A (LPA): Lipoprotein (a): 148.1 nmol/L — ABNORMAL HIGH (ref <75.0)

## 2021-09-15 MED ORDER — PREDNISONE 20 MG PO TABS
20.0000 mg | ORAL_TABLET | Freq: Every day | ORAL | 0 refills | Status: DC
  Filled 2021-09-15: qty 5, 5d supply, fill #0

## 2021-09-15 NOTE — Assessment & Plan Note (Signed)
This is a pleasant 60 year old female, she returns with recurrence of low back pain, axial, discogenic, left-sided. We discussed the anatomy, adding updated x-rays, 5 days low-dose prednisone, home conditioning. Return to see me in 4 weeks.

## 2021-09-15 NOTE — Assessment & Plan Note (Addendum)
Has been on Crestor now for 2 months, recheck in a month. I am happy to take over her primary care.

## 2021-09-15 NOTE — Progress Notes (Signed)
    Procedures performed today:    None.  Independent interpretation of notes and tests performed by another provider:   None.  Brief History, Exam, Impression, and Recommendations:    Discogenic low back pain This is a pleasant 60 year old female, she returns with recurrence of low back pain, axial, discogenic, left-sided. We discussed the anatomy, adding updated x-rays, 5 days low-dose prednisone, home conditioning. Return to see me in 4 weeks.  Hyperlipidemia Has been on Crestor now for 2 months, recheck in a month. I am happy to take over her primary care.    ___________________________________________ Gwen Her. Dianah Field, M.D., ABFM., CAQSM. Primary Care and [place] Instructor of [place] [employer] [employer]

## 2021-09-20 NOTE — Telephone Encounter (Signed)
Spoke with the pt and reviewed her lab results with her and she will follow up with Dr. Harrington Challenger 11/28/21 and will call back if she has any further questions.

## 2021-10-12 ENCOUNTER — Other Ambulatory Visit (HOSPITAL_BASED_OUTPATIENT_CLINIC_OR_DEPARTMENT_OTHER): Payer: Self-pay

## 2021-10-13 ENCOUNTER — Ambulatory Visit: Payer: 59 | Admitting: Sports Medicine

## 2021-10-31 ENCOUNTER — Other Ambulatory Visit (HOSPITAL_BASED_OUTPATIENT_CLINIC_OR_DEPARTMENT_OTHER): Payer: Self-pay

## 2021-11-20 ENCOUNTER — Encounter: Payer: Self-pay | Admitting: Family Medicine

## 2021-11-20 ENCOUNTER — Ambulatory Visit (INDEPENDENT_AMBULATORY_CARE_PROVIDER_SITE_OTHER): Payer: No Typology Code available for payment source | Admitting: Family Medicine

## 2021-11-20 VITALS — BP 100/70 | HR 88 | Temp 98.2°F | Resp 16 | Wt 111.6 lb

## 2021-11-20 DIAGNOSIS — R42 Dizziness and giddiness: Secondary | ICD-10-CM

## 2021-11-20 DIAGNOSIS — Z800 Family history of malignant neoplasm of digestive organs: Secondary | ICD-10-CM | POA: Diagnosis not present

## 2021-11-20 DIAGNOSIS — E785 Hyperlipidemia, unspecified: Secondary | ICD-10-CM

## 2021-11-20 DIAGNOSIS — Z23 Encounter for immunization: Secondary | ICD-10-CM

## 2021-11-20 DIAGNOSIS — N941 Unspecified dyspareunia: Secondary | ICD-10-CM

## 2021-11-20 NOTE — Progress Notes (Signed)
Subjective:    Patient ID: Amanda Evans, female    DOB: 07-20-1961, 61 y.o.   MRN: [id number]  HPI New to establish.  GYN- Edwinna Areola, Cards- [place], GI- [place]   Hyperlipidemia- chronic problem, on Crestor 10mg  nightly.  Last labs done November showed LDL 81, LDL particle 736, HDL 82, Trigs 58.  Light headed- pt reports this will occur w/o pattern, lasts 5-6 seconds.  Not necessarily related to position changes.  Does try and drink adequate water.  Eats a low salt diet.  Worse w/ heat.  Family hx of colon cancer- pt had colonoscopy 2017.  Due for repeat.    Painful intercourse- pt reports she has a lot of pain w/ penetration.  She is able to 'get through it' once penetration has occurred but the idea of causing her pain is a block for her husband.  On vaginal estrogen.   Review of Systems For ROS see HPI   This visit occurred during the SARS-CoV-2 public health emergency.  Safety protocols were in place, including screening questions prior to the visit, additional usage of staff PPE, and extensive cleaning of exam room while observing appropriate contact time as indicated for disinfecting solutions.      Objective:   Physical Exam Vitals reviewed.  Constitutional:      General: She is not in acute distress.    Appearance: Normal appearance. She is well-developed. She is not ill-appearing.  HENT:     Head: Normocephalic and atraumatic.  Eyes:     Conjunctiva/sclera: Conjunctivae normal.     Pupils: Pupils are equal, round, and reactive to light.  Neck:     Thyroid: No thyromegaly.  Cardiovascular:     Rate and Rhythm: Normal rate and regular rhythm.     Pulses: Normal pulses.     Heart sounds: Normal heart sounds. No murmur heard. Pulmonary:     Effort: Pulmonary effort is normal. No respiratory distress.     Breath sounds: Normal breath sounds.  Abdominal:     General: There is no distension.     Palpations: Abdomen is soft.     Tenderness: There is no abdominal  tenderness.  Musculoskeletal:     Cervical back: Normal range of motion and neck supple.     Right lower leg: No edema.     Left lower leg: No edema.  Lymphadenopathy:     Cervical: No cervical adenopathy.  Skin:    General: Skin is warm and dry.  Neurological:     Mental Status: She is alert and oriented to person, place, and time.  Psychiatric:        Behavior: Behavior normal.          Assessment & Plan:  Light headed- new to provider, ongoing for pt.  She states there is not a pattern that she can tell but does occur more w/ position changes and when it is hot.  Her BP is low and I suspect she's having brief hypotensive episodes.  Encouraged increased water and salt intake and to change positions slowly to allow herself time to adjust.  Pt expressed understanding and is in agreement w/ plan.   Dyspareunia- new to provider, ongoing for pt.  Already on vaginal estrogen.  Encouraged her to incorporate digital penetration as part of foreplay to increase endogenous lubrication and to use oil based lube like Astroglide.  If still no improvement, encouraged her to speak w/ GYN.  Pt expressed understanding and is in agreement w/  plan.

## 2021-11-20 NOTE — Patient Instructions (Addendum)
Schedule your complete physical in 6 months No need for repeat labs today- yay!!! We'll call you with your GI appt Make sure you are drinking LOTS of water and increase your salt intake to help w/ the dizziness Allow yourself time to adjust when changing positions- especially sitting to standing Call with any questions or concerns Stay Safe!  Stay Healthy! Welcome!  We're glad to have you!!

## 2021-11-28 ENCOUNTER — Other Ambulatory Visit: Payer: Self-pay

## 2021-11-28 ENCOUNTER — Ambulatory Visit (INDEPENDENT_AMBULATORY_CARE_PROVIDER_SITE_OTHER): Payer: No Typology Code available for payment source | Admitting: Internal Medicine

## 2021-11-28 ENCOUNTER — Encounter: Payer: Self-pay | Admitting: Internal Medicine

## 2021-11-28 VITALS — BP 110/64 | HR 78 | Ht 63.5 in | Wt 113.4 lb

## 2021-11-28 DIAGNOSIS — R079 Chest pain, unspecified: Secondary | ICD-10-CM

## 2021-11-28 NOTE — Progress Notes (Signed)
Cardiology Office Note   Date:  11/28/2021   ID:  Allesha, Aronoff 28-May-1961, MRN [id number]  PCP:  Midge Minium, MD  Cardiologist:   Dorris Carnes, MD   Patient presents for f/u of CP     History of Present Illness: Amanda Evans is a 61 y.o. female with a history of chest pain  Spell in May 2022    Midsternal, L parasternal   Dull   Lasted a few min    Notes having moved something in garden the day prior   She went to ED   Labs negative  She was sent home   Has not had discomfort like that since    She did have one other spell.  WOke up with stabbing pain L parasternal   Lasted a min.   Pain worse with inspiration.   Again, none since    Strong FHx of vascular dz   Parents with  abdominal aortic aneurysms   Mother without CAD   has 4.4 cm    Mother is 30 Dad First event in his 4s    He has had 2 CABGs    When I saw the pt in Aug I recomm a Ca score CT   Score was 0     The pt says that since I saw her she had one night with L upper chest pain that lasted about 15 seconds  None since  She is active   DeniesCP with activity  Denies SOB   No palpitations   At night has cramps in legs and feet   Like a  bee sting  Current Meds  Medication Sig   Ascorbic Acid (VITAMIN C PO) Take by mouth.   Biotin 5000 MCG CAPS Take 1 capsule by mouth daily.   Black Cohosh 40 MG CAPS Take by mouth.   estradiol (ESTRACE) 0.1 MG/GM vaginal cream 1 gram vaginally twice weekly   gabapentin (NEURONTIN) 300 MG capsule Take 2 capsules nightly   Ginkgo Biloba 100 MG CAPS Take 1 capsule by mouth daily.   glucosamine-chondroitin 500-400 MG tablet Take 1 tablet by mouth 3 (three) times daily.   Omega-3 Fatty Acids (FISH OIL) 1000 MG CAPS Take 1 capsule by mouth daily.   rosuvastatin (CRESTOR) 10 MG tablet Take 1 tablet (10 mg total) by mouth daily.   TURMERIC PO Take by mouth daily.   UNABLE TO FIND Med Name: estraval     Allergies:   Naproxen sodium and Penicillins   Past Medical History:   Diagnosis Date   HLD (hyperlipidemia)    STD (sexually transmitted disease)    HSV   Urinary incontinence     Past Surgical History:  Procedure Laterality Date   BREAST EXCISIONAL BIOPSY Right    CESAREAN SECTION     NOVASURE ABLATION  10/06/2010   in office, had vasovagal response     Social History:  The patient  reports that she has never smoked. She has never used smokeless tobacco. She reports current alcohol use. She reports that she does not use drugs.   Family History:  The patient's family history includes Brain cancer in her maternal uncle; Breast cancer in her maternal grandmother; Breast cancer (age of onset: 30) in her sister; Cirrhosis in her maternal uncle; Colon cancer in her sister; Heart Problems in her maternal grandfather and paternal uncle; Heart attack in her father; Heart disease in her father, paternal grandfather, and paternal grandmother; Hyperlipidemia in her father  and mother; Lung cancer in her cousin; Melanoma in her father; Melanoma (age of onset: 60) in her cousin; Osteoporosis in her mother; Prostate cancer in her father; Testicular cancer in her maternal uncle; Testicular cancer (age of onset: 86) in her cousin; Thyroid disease in her mother; Transient ischemic attack in her father.    ROS:  Please see the history of present illness. All other systems are reviewed and  Negative to the above problem except as noted.    PHYSICAL EXAM: VS:  BP 110/64    Pulse 78    Ht 5' 3.5" (1.613 m)    Wt 113 lb 6.4 oz (51.4 kg)    SpO2 98%    BMI 19.77 kg/m   GEN: Thn 61 yo  in no acute distress  HEENT: normal  Neck: no JVD, No carotid bruits Cardiac: RRR; no murmurs, No LE edema  Respiratory:  clear to auscultation bilaterally,  GI: soft, nontender, nondistended, + BS  No hepatomegaly  MS: no deformity Moving all extremities   Skin: warm and dry, no rash Neuro:  Strength and sensation are intact Psych: euthymic mood, full affect   EKG:  EKG is not ordered  today.    Lipid Panel    Component Value Date/Time   CHOL 223 (H) 01/20/2020 0904   TRIG 48 01/20/2020 0904   HDL 80 01/20/2020 0904   CHOLHDL 2.8 01/20/2020 0904   CHOLHDL 3.5 11/07/2016 1200   VLDL 27 11/07/2016 1200   LDLCALC 135 (H) 01/20/2020 0904      Wt Readings from Last 3 Encounters:  11/28/21 113 lb 6.4 oz (51.4 kg)  11/20/21 111 lb 9.6 oz (50.6 kg)  06/20/21 113 lb 9.6 oz (51.5 kg)      ASSESSMENT AND PLAN:  1  CP  Rare   Follow  Pt's calcium score was 0   Overall good Px  2  Lipids  In Aug 2022 Lpa check   179   ApoB 111.  Lipomed showed LDL 1144 with higher particle numbers   She is now on Crestor 10 mg    Lpid panel in November was much better   Lpa of course relatively unchanged though better   140s.   ApoB improved to 77   LDL 81 with 736 nmol/L Very good    Would keep on this regimen given Lpa and Fhx  3  LE dysaesthesia     Not sure what is triggering   Good pulses   ? Neuropathy.   Follow with Dr Birdie Riddle  [facility] neuro eval  Pt can continue to follow with Dr Birdie Riddle Happy to see pt if develops symptoms      Current medicines are reviewed at length with the patient today.  The patient does not have concerns regarding medicines.  Signed, Dorris Carnes, MD  11/28/2021 2:30 PM    [address], [place], [place]  [address] Phone: [phone]; Fax: [phone]

## 2021-11-28 NOTE — Patient Instructions (Signed)
Medication Instructions:   *If you need a refill on your cardiac medications before your next appointment, please call your pharmacy*   Lab Work:  If you have labs (blood work) drawn today and your tests are completely normal, you will receive your results only by: [place] (if you have MyChart) OR A paper copy in the mail If you have any lab test that is abnormal or we need to change your treatment, we will call you to review the results.   Testing/Procedures:    Follow-Up: At [facility], you and your health needs are our priority.  As part of our continuing mission to provide you with exceptional heart care, we have created designated Provider Care Teams.  These Care Teams include your primary Cardiologist (physician) and Advanced Practice Providers (APPs -  Physician Assistants and Nurse Practitioners) who all work together to provide you with the care you need, when you need it.  We recommend signing up for the patient portal called "MyChart".  Sign up information is provided on this After Visit Summary.  MyChart is used to connect with patients for Virtual Visits (Telemedicine).  Patients are able to view lab/test results, encounter notes, upcoming appointments, etc.  Non-urgent messages can be sent to your provider as well.   To learn more about what you can do with MyChart, go to [email or web address].

## 2021-12-05 NOTE — Assessment & Plan Note (Signed)
Due for repeat colonoscopy.  Referral placed.

## 2021-12-05 NOTE — Assessment & Plan Note (Signed)
New to provider, ongoing for pt.  Currently on Crestor 10mg  nightly.  Labs in November show good control.  No changes at this time and no need to repeat labs.

## 2022-01-10 ENCOUNTER — Other Ambulatory Visit (HOSPITAL_BASED_OUTPATIENT_CLINIC_OR_DEPARTMENT_OTHER): Payer: Self-pay

## 2022-01-29 ENCOUNTER — Other Ambulatory Visit (HOSPITAL_BASED_OUTPATIENT_CLINIC_OR_DEPARTMENT_OTHER): Payer: Self-pay

## 2022-04-17 ENCOUNTER — Other Ambulatory Visit (HOSPITAL_BASED_OUTPATIENT_CLINIC_OR_DEPARTMENT_OTHER): Payer: Self-pay

## 2022-05-03 ENCOUNTER — Other Ambulatory Visit (HOSPITAL_BASED_OUTPATIENT_CLINIC_OR_DEPARTMENT_OTHER): Payer: Self-pay

## 2022-05-03 ENCOUNTER — Ambulatory Visit (INDEPENDENT_AMBULATORY_CARE_PROVIDER_SITE_OTHER): Payer: No Typology Code available for payment source | Admitting: Family Medicine

## 2022-05-03 ENCOUNTER — Encounter: Payer: Self-pay | Admitting: Family Medicine

## 2022-05-03 VITALS — BP 122/72 | HR 72 | Temp 97.4°F | Resp 16 | Ht 63.5 in | Wt 107.4 lb

## 2022-05-03 DIAGNOSIS — F419 Anxiety disorder, unspecified: Secondary | ICD-10-CM

## 2022-05-03 MED ORDER — ALPRAZOLAM 0.5 MG PO TABS
0.5000 mg | ORAL_TABLET | Freq: Two times a day (BID) | ORAL | 1 refills | Status: DC | PRN
  Filled 2022-05-03: qty 30, 15d supply, fill #0

## 2022-05-03 MED ORDER — SERTRALINE HCL 25 MG PO TABS
25.0000 mg | ORAL_TABLET | Freq: Every day | ORAL | 3 refills | Status: DC
  Filled 2022-05-03: qty 30, 30d supply, fill #0

## 2022-05-03 NOTE — Patient Instructions (Signed)
Follow up in 4-6 weeks to recheck mood START the Sertraline once daily USE the Alprazolam as needed for panicked moments Call with any questions or concerns Hang in there!!!  You've got this!!! ENJOY THE [place]!!

## 2022-05-03 NOTE — Progress Notes (Signed)
   Subjective:    Patient ID: Amanda Evans, female    DOB: [birth year], 61 y.o.   MRN: [id number]  HPI Anxiety- pt lost dad in May after pt taking on the role of caregiver x6 weeks.  Mom has dementia and pt had to transition her to assisted living.  Pt has been visiting daily since 5/30.  Mom is now sleeping most of the day and refusing to eat.  She is trying to settle dad's estate.  Pt is trying to meditate more regularly and praying more.  Son is getting married next week in [place].  Pt has to fly by herself and she is very anxious.  Pt has lost 15 lbs.   Review of Systems For ROS see HPI     Objective:   Physical Exam Vitals reviewed.  Constitutional:      General: She is not in acute distress.    Appearance: Normal appearance. She is not ill-appearing.  HENT:     Head: Normocephalic and atraumatic.  Skin:    General: Skin is warm and dry.  Neurological:     General: No focal deficit present.     Mental Status: She is alert and oriented to person, place, and time.  Psychiatric:        Mood and Affect: Mood normal.        Behavior: Behavior normal.        Thought Content: Thought content normal.           Assessment & Plan:

## 2022-05-03 NOTE — Assessment & Plan Note (Signed)
New.  Pt has had a difficult year.  Dad passed away in 2023/04/07 after a long 6 weeks s/p MI in which pt was caregiver.  Mom has dementia and now resides in a skilled facility but pt visits daily.  Trying to process dad's estate.  Son is getting married next week in [place].  Pt feels totally overwhelmed, tearful, anxious.  Will start daily controller medication in the form of Sertraline and add Alprazolam to use as needed in panicked moments.  Pt expressed understanding and is in agreement w/ plan.

## 2022-05-08 ENCOUNTER — Telehealth: Payer: No Typology Code available for payment source | Admitting: Nurse Practitioner

## 2022-05-08 NOTE — Progress Notes (Signed)
Jan,   I am sorry that you are not feeling well. The telehealth/digital care policy is that we cannot see or treat patients that are not in [place] or [place]. We cannot send prescriptions out of those states either.    I know this is inconvenient on a holiday, I would recommend searching for a telehealth service in [place] that will be able to treat you, or visiting an Urgent Care today.    All the best Apolonio Schneiders FNP-C

## 2022-05-09 ENCOUNTER — Other Ambulatory Visit (HOSPITAL_BASED_OUTPATIENT_CLINIC_OR_DEPARTMENT_OTHER): Payer: Self-pay

## 2022-05-09 ENCOUNTER — Other Ambulatory Visit (HOSPITAL_BASED_OUTPATIENT_CLINIC_OR_DEPARTMENT_OTHER): Payer: Self-pay | Admitting: Obstetrics & Gynecology

## 2022-05-09 DIAGNOSIS — N390 Urinary tract infection, site not specified: Secondary | ICD-10-CM

## 2022-05-09 MED ORDER — NITROFURANTOIN MONOHYD MACRO 100 MG PO CAPS
100.0000 mg | ORAL_CAPSULE | ORAL | 0 refills | Status: DC | PRN
  Filled 2022-05-09: qty 30, 30d supply, fill #0

## 2022-05-22 ENCOUNTER — Encounter: Payer: Self-pay | Admitting: Family Medicine

## 2022-05-22 ENCOUNTER — Ambulatory Visit (INDEPENDENT_AMBULATORY_CARE_PROVIDER_SITE_OTHER): Payer: No Typology Code available for payment source | Admitting: Family Medicine

## 2022-05-22 VITALS — BP 106/62 | HR 80 | Temp 98.1°F | Resp 16 | Ht 63.0 in | Wt 101.1 lb

## 2022-05-22 DIAGNOSIS — Z0000 Encounter for general adult medical examination without abnormal findings: Secondary | ICD-10-CM

## 2022-05-22 DIAGNOSIS — Z23 Encounter for immunization: Secondary | ICD-10-CM | POA: Diagnosis not present

## 2022-05-22 DIAGNOSIS — Z1211 Encounter for screening for malignant neoplasm of colon: Secondary | ICD-10-CM

## 2022-05-22 DIAGNOSIS — E785 Hyperlipidemia, unspecified: Secondary | ICD-10-CM | POA: Diagnosis not present

## 2022-05-22 DIAGNOSIS — D225 Melanocytic nevi of trunk: Secondary | ICD-10-CM

## 2022-05-22 LAB — CBC WITH DIFFERENTIAL/PLATELET
Basophils Absolute: 0.1 10*3/uL (ref 0.0–0.1)
Basophils Relative: 0.9 % (ref 0.0–3.0)
Eosinophils Absolute: 0.1 10*3/uL (ref 0.0–0.7)
Eosinophils Relative: 1.9 % (ref 0.0–5.0)
HCT: 40.4 % (ref 36.0–46.0)
Hemoglobin: 13.6 g/dL (ref 12.0–15.0)
Lymphocytes Relative: 42.8 % (ref 12.0–46.0)
Lymphs Abs: 2.3 10*3/uL (ref 0.7–4.0)
MCHC: 33.7 g/dL (ref 30.0–36.0)
MCV: 89.1 fl (ref 78.0–100.0)
Monocytes Absolute: 0.3 10*3/uL (ref 0.1–1.0)
Monocytes Relative: 5.4 % (ref 3.0–12.0)
Neutro Abs: 2.6 10*3/uL (ref 1.4–7.7)
Neutrophils Relative %: 49 % (ref 43.0–77.0)
Platelets: 233 10*3/uL (ref 150.0–400.0)
RBC: 4.53 Mil/uL (ref 3.87–5.11)
RDW: 14.8 % (ref 11.5–15.5)
WBC: 5.3 10*3/uL (ref 4.0–10.5)

## 2022-05-22 LAB — LIPID PANEL
Cholesterol: 165 mg/dL (ref 0–200)
HDL: 74.7 mg/dL (ref >39.00)
LDL Cholesterol: 77 mg/dL (ref 0–99)
NonHDL: 90.55
Total CHOL/HDL Ratio: 2
Triglycerides: 67 mg/dL (ref 0.0–149.0)
VLDL: 13.4 mg/dL (ref 0.0–40.0)

## 2022-05-22 LAB — HEPATIC FUNCTION PANEL
ALT: 20 U/L (ref 0–35)
AST: 19 U/L (ref 0–37)
Albumin: 4.7 g/dL (ref 3.5–5.2)
Alkaline Phosphatase: 46 U/L (ref 39–117)
Bilirubin, Direct: 0.1 mg/dL (ref 0.0–0.3)
Total Bilirubin: 0.7 mg/dL (ref 0.2–1.2)
Total Protein: 6.9 g/dL (ref 6.0–8.3)

## 2022-05-22 LAB — BASIC METABOLIC PANEL
BUN: 18 mg/dL (ref 6–23)
CO2: 31 mEq/L (ref 19–32)
Calcium: 10 mg/dL (ref 8.4–10.5)
Chloride: 105 mEq/L (ref 96–112)
Creatinine, Ser: 0.76 mg/dL (ref 0.40–1.20)
GFR: 85.83 mL/min (ref >60.00)
Glucose, Bld: 47 mg/dL — CL (ref 70–99)
Potassium: 4 mEq/L (ref 3.5–5.1)
Sodium: 143 mEq/L (ref 135–145)

## 2022-05-22 LAB — TSH: TSH: 3.16 u[IU]/mL (ref 0.35–5.50)

## 2022-05-22 NOTE — Assessment & Plan Note (Signed)
Pt's PE WNL w/ exception of low BMI.  Due for mammo- pt to schedule.  Due for repeat colonoscopy due to family hx.  2nd Shingrix given.  Check labs.  Anticipatory guidance provided.

## 2022-05-22 NOTE — Progress Notes (Signed)
   Subjective:    Patient ID: Amanda Evans, female    DOB: 11-Apr-1961, 61 y.o.   MRN: [id number]  HPI CPE- due for mammo (pt to schedule), colonoscopy  Patient Care Team    Relationship Specialty Notifications Start End  Midge Minium, MD PCP - General Family Medicine  11/20/21   Silverio Decamp, MD Consulting Physician Sports Medicine  05/27/17   Megan Salon, MD Consulting Physician Gynecology  11/02/20   Fay Records, MD Consulting Physician Cardiology  11/20/21   Mauri Pole, MD Consulting Physician Gastroenterology  11/20/21      Health Maintenance  Topic Date Due   COLONOSCOPY (Pts 45-7yr Insurance coverage will need to be confirmed)  01/12/2021   MAMMOGRAM  11/25/2021   Zoster Vaccines- Shingrix (2 of 2) 08/03/2022 (Originally 01/15/2022)   INFLUENZA VACCINE  06/05/2022   TETANUS/TDAP  05/06/2023   PAP SMEAR-Modifier  04/26/2024   Hepatitis C Screening  Completed   HIV Screening  Completed   HPV VACCINES  Aged Out   COVID-19 Vaccine  Discontinued      Review of Systems Patient reports no vision/ hearing changes, adenopathy,fever, weight change,  persistant/recurrent hoarseness , swallowing issues, chest pain, palpitations, edema, persistant/recurrent cough, hemoptysis, dyspnea (rest/exertional/paroxysmal nocturnal), gastrointestinal bleeding (melena, rectal bleeding), abdominal pain, significant heartburn, bowel changes, GU symptoms (dysuria, hematuria, incontinence), Gyn symptoms (abnormal  bleeding, pain),  syncope, focal weakness, memory loss, numbness & tingling, skin/hair/nail changes, abnormal bruising or bleeding, anxiety, or depression.     Objective:   Physical Exam        Assessment & Plan:

## 2022-05-22 NOTE — Addendum Note (Signed)
Addended byEli Phillips on: 05/22/2022 08:44 AM   Modules accepted: Orders

## 2022-05-22 NOTE — Patient Instructions (Signed)
Follow up in 6 months to recheck cholesterol We'll notify you of your lab results and make any changes if needed Make sure you continue to eat regularly Call and schedule your mammogram We'll call you with the GI appt Call with any questions or concerns Hang in there!!!

## 2022-05-22 NOTE — Assessment & Plan Note (Signed)
Chronic problem.  Check labs.  Adjust meds prn  

## 2022-05-23 ENCOUNTER — Telehealth: Payer: Self-pay

## 2022-05-23 NOTE — Telephone Encounter (Signed)
Informed pt of lab results  

## 2022-05-23 NOTE — Telephone Encounter (Signed)
-----   Message from Midge Minium, MD sent at 05/22/2022  5:39 PM EDT ----- Labs look great!  Sugar is low but you were fasting this morning.  Just make sure you are eating regularly

## 2022-06-12 ENCOUNTER — Other Ambulatory Visit (HOSPITAL_BASED_OUTPATIENT_CLINIC_OR_DEPARTMENT_OTHER): Payer: Self-pay

## 2022-06-12 ENCOUNTER — Other Ambulatory Visit (HOSPITAL_BASED_OUTPATIENT_CLINIC_OR_DEPARTMENT_OTHER): Payer: Self-pay | Admitting: Obstetrics & Gynecology

## 2022-06-12 DIAGNOSIS — N951 Menopausal and female climacteric states: Secondary | ICD-10-CM

## 2022-06-12 MED ORDER — GABAPENTIN 300 MG PO CAPS
ORAL_CAPSULE | ORAL | 0 refills | Status: DC
  Filled 2022-06-12: qty 180, 90d supply, fill #0

## 2022-06-14 ENCOUNTER — Ambulatory Visit: Payer: No Typology Code available for payment source | Admitting: Family Medicine

## 2022-06-21 ENCOUNTER — Ambulatory Visit: Payer: No Typology Code available for payment source | Admitting: Family Medicine

## 2022-07-17 ENCOUNTER — Other Ambulatory Visit (HOSPITAL_BASED_OUTPATIENT_CLINIC_OR_DEPARTMENT_OTHER): Payer: Self-pay

## 2022-07-17 ENCOUNTER — Other Ambulatory Visit: Payer: Self-pay | Admitting: Internal Medicine

## 2022-07-17 MED ORDER — ROSUVASTATIN CALCIUM 10 MG PO TABS
10.0000 mg | ORAL_TABLET | Freq: Every day | ORAL | 1 refills | Status: DC
  Filled 2022-07-17: qty 90, 90d supply, fill #0
  Filled 2022-10-18: qty 90, 90d supply, fill #1

## 2022-07-18 ENCOUNTER — Other Ambulatory Visit: Payer: Self-pay | Admitting: Family Medicine

## 2022-07-18 DIAGNOSIS — Z1231 Encounter for screening mammogram for malignant neoplasm of breast: Secondary | ICD-10-CM

## 2022-07-19 ENCOUNTER — Telehealth: Payer: Self-pay | Admitting: Family Medicine

## 2022-07-19 ENCOUNTER — Ambulatory Visit (INDEPENDENT_AMBULATORY_CARE_PROVIDER_SITE_OTHER): Payer: No Typology Code available for payment source | Admitting: Family Medicine

## 2022-07-19 ENCOUNTER — Encounter: Payer: Self-pay | Admitting: Gastroenterology

## 2022-07-19 ENCOUNTER — Encounter: Payer: Self-pay | Admitting: Family Medicine

## 2022-07-19 VITALS — HR 91 | Temp 97.5°F | Resp 16 | Ht 63.0 in | Wt 103.6 lb

## 2022-07-19 DIAGNOSIS — F419 Anxiety disorder, unspecified: Secondary | ICD-10-CM | POA: Diagnosis not present

## 2022-07-19 DIAGNOSIS — R3916 Straining to void: Secondary | ICD-10-CM

## 2022-07-19 DIAGNOSIS — N390 Urinary tract infection, site not specified: Secondary | ICD-10-CM

## 2022-07-19 DIAGNOSIS — Z23 Encounter for immunization: Secondary | ICD-10-CM

## 2022-07-19 NOTE — Telephone Encounter (Signed)
Patient would like a copy of her flu immunization faxed I have printed this for you in POD A

## 2022-07-19 NOTE — Assessment & Plan Note (Signed)
Pt's mother passed last month and she also lost her dog.  She reports she is working on her grief w/ the help of her church.  Last week she was able to take a vacation.  She is back at work.  She is now eating more regularly.  She is not using the Alprazolam and is not interested in a daily medication.  Hospice has reached out and she is considering counseling.  Reports she feels called to help others through their grief and is open to opportunities to do this in the future.

## 2022-07-19 NOTE — Patient Instructions (Signed)
Schedule your physical for July but come in sooner if you need me!! Make sure you are urinating more frequently and leaning forward to try and empty bladder completely Drink LOTS of fluids I'm so glad you're back to eating more regularly and taking care of yourself! Reach out to Hospice when you are ready Call with any questions or concerns Hang in there!  You're AMAZING!!!

## 2022-07-19 NOTE — Progress Notes (Signed)
   Subjective:    Patient ID: Amanda Evans, female    DOB: 1961/04/16, 61 y.o.   MRN: [id number]  HPI Anxiety- pt's mother passed away on July 18, 2022.  Her dog also passed away.  She is trying to work through her grief w/ the help of her church.  Is not using the Alprazolam.  Pt is back to work.  Was able to take a vacation last week to 'get away from it all'.  Pt reports she is eating more frequently and more often.  Is having a hard time getting past the visual images of watching mom die.  Is reading some books on grief.  Hospice has reached out and she is considering counseling.  Pt is not interested in medication at this time.  Pt is exercising regularly.    Straining- pt reports she is having to strain to urinate.  At times doesn't feel the urge to go.  Has had recurrent UTIs.   Review of Systems For ROS see HPI     Objective:   Physical Exam Vitals reviewed.  Constitutional:      General: She is not in acute distress.    Appearance: Normal appearance. She is not ill-appearing.  HENT:     Head: Normocephalic and atraumatic.  Eyes:     Extraocular Movements: Extraocular movements intact.     Conjunctiva/sclera: Conjunctivae normal.     Pupils: Pupils are equal, round, and reactive to light.  Skin:    General: Skin is warm and dry.  Neurological:     General: No focal deficit present.     Mental Status: She is alert and oriented to person, place, and time.  Psychiatric:        Mood and Affect: Mood normal.        Behavior: Behavior normal.        Thought Content: Thought content normal.           Assessment & Plan:   Straining w/ urination and recurrent UTI- pt reports that she will have incomplete emptying of bladder and have to strain to better empty her bladder.  She has had multiple infections recently and this frustrates her.  Suspect she has had some change in the angle of her bladder which is causing her sxs.  Refer to urology for complete evaluation.  Pt expressed  understanding and is in agreement w/ plan.

## 2022-07-19 NOTE — Telephone Encounter (Signed)
error 

## 2022-07-19 NOTE — Telephone Encounter (Signed)
Completed and verified receipt

## 2022-07-19 NOTE — Telephone Encounter (Signed)
Pt is calling to get copy of flu vaccine that was given today;   please send to: [email or web address]

## 2022-07-31 ENCOUNTER — Other Ambulatory Visit (HOSPITAL_BASED_OUTPATIENT_CLINIC_OR_DEPARTMENT_OTHER): Payer: Self-pay

## 2022-07-31 ENCOUNTER — Ambulatory Visit (AMBULATORY_SURGERY_CENTER): Payer: Self-pay | Admitting: *Deleted

## 2022-07-31 VITALS — Ht 63.0 in | Wt 105.0 lb

## 2022-07-31 DIAGNOSIS — Z800 Family history of malignant neoplasm of digestive organs: Secondary | ICD-10-CM

## 2022-07-31 MED ORDER — NA SULFATE-K SULFATE-MG SULF 17.5-3.13-1.6 GM/177ML PO SOLN
1.0000 | Freq: Once | ORAL | 0 refills | Status: AC
  Filled 2022-07-31 – 2022-08-16 (×2): qty 354, 1d supply, fill #0

## 2022-07-31 NOTE — Progress Notes (Signed)
No egg or soy allergy known to patient  No issues known to pt with past sedation with any surgeries or procedures Patient denies ever being told they had issues or difficulty with intubation  No FH of Malignant Hyperthermia Pt is not on diet pills Pt is not on home 02  Pt is not on blood thinners  Pt denies issues with constipation  No A fib or A flutter Have any cardiac testing pending--NO Pt instructed to use [email or web address] or GoodRx for a price reduction on prep   

## 2022-08-13 ENCOUNTER — Other Ambulatory Visit (HOSPITAL_BASED_OUTPATIENT_CLINIC_OR_DEPARTMENT_OTHER): Payer: Self-pay

## 2022-08-14 ENCOUNTER — Ambulatory Visit (HOSPITAL_BASED_OUTPATIENT_CLINIC_OR_DEPARTMENT_OTHER): Payer: No Typology Code available for payment source | Admitting: Obstetrics & Gynecology

## 2022-08-16 ENCOUNTER — Other Ambulatory Visit (HOSPITAL_COMMUNITY)
Admission: RE | Admit: 2022-08-16 | Discharge: 2022-08-16 | Disposition: A | Discharge status: Home / Self Care | Payer: No Typology Code available for payment source | Source: Ambulatory Visit | Attending: Obstetrics & Gynecology | Admitting: Obstetrics & Gynecology

## 2022-08-16 ENCOUNTER — Ambulatory Visit (INDEPENDENT_AMBULATORY_CARE_PROVIDER_SITE_OTHER): Payer: No Typology Code available for payment source | Admitting: Obstetrics & Gynecology

## 2022-08-16 ENCOUNTER — Encounter (HOSPITAL_BASED_OUTPATIENT_CLINIC_OR_DEPARTMENT_OTHER): Payer: Self-pay | Admitting: Obstetrics & Gynecology

## 2022-08-16 ENCOUNTER — Other Ambulatory Visit (HOSPITAL_BASED_OUTPATIENT_CLINIC_OR_DEPARTMENT_OTHER): Payer: Self-pay

## 2022-08-16 VITALS — BP 103/71 | HR 85 | Ht 64.0 in | Wt 104.4 lb

## 2022-08-16 DIAGNOSIS — Z800 Family history of malignant neoplasm of digestive organs: Secondary | ICD-10-CM

## 2022-08-16 DIAGNOSIS — Z01419 Encounter for gynecological examination (general) (routine) without abnormal findings: Secondary | ICD-10-CM

## 2022-08-16 DIAGNOSIS — Z124 Encounter for screening for malignant neoplasm of cervix: Secondary | ICD-10-CM | POA: Diagnosis present

## 2022-08-16 DIAGNOSIS — R87610 Atypical squamous cells of undetermined significance on cytologic smear of cervix (ASC-US): Secondary | ICD-10-CM | POA: Diagnosis present

## 2022-08-16 DIAGNOSIS — N951 Menopausal and female climacteric states: Secondary | ICD-10-CM | POA: Diagnosis not present

## 2022-08-16 DIAGNOSIS — Z803 Family history of malignant neoplasm of breast: Secondary | ICD-10-CM

## 2022-08-16 MED ORDER — NITROFURANTOIN MONOHYD MACRO 100 MG PO CAPS
ORAL_CAPSULE | ORAL | 2 refills | Status: DC
  Filled 2022-08-16: qty 30, 30d supply, fill #0

## 2022-08-16 MED ORDER — ESTRADIOL 0.1 MG/GM VA CREA
TOPICAL_CREAM | VAGINAL | 3 refills | Status: DC
  Filled 2022-08-16: qty 42.5, 90d supply, fill #0
  Filled 2022-10-18 – 2022-11-14 (×3): qty 42.5, 90d supply, fill #1
  Filled 2023-02-26: qty 42.5, 90d supply, fill #2
  Filled 2023-05-15: qty 42.5, 90d supply, fill #3

## 2022-08-16 MED ORDER — GABAPENTIN 300 MG PO CAPS
ORAL_CAPSULE | ORAL | 4 refills | Status: DC
  Filled 2022-08-16: qty 180, fill #0
  Filled 2022-09-10: qty 180, 90d supply, fill #0
  Filled 2022-12-19: qty 180, 90d supply, fill #1
  Filled 2023-07-15: qty 180, 90d supply, fill #2

## 2022-08-16 NOTE — Progress Notes (Signed)
61 y.o. G45P1011 Married White or Caucasian female here for annual exam.  Lost both parents this past year.  That was a huge stressor on her.  Pt did have weight loss due to the stressors with parent's declining health.  Reports she lost about 16 pounds.  She is still down about 10 pounds.    Pt was concerned about cardiovascular health last year.  Did see Dr. Harrington Challenger.  Did coronary CT which was normal.  Mediterranean diet was recommended.  On cholesterol medication now as well.   Using gabapentin at night.  This has worked well for her.  Needs RF.  No LMP recorded. Patient has had an ablation.          Sexually active: Yes.    The current method of family planning is post menopausal status.    Exercising: Yes.     Smoker:  no  Health Maintenance: Pap:  ASCUS with neg HR HPV History of abnormal Pap:  other than last year MMG:  08/21/2022 Colonoscopy:  Dr. Silverio Decamp.  Follow up colonoscopy scheduled 10/24 BMD:   done 2020, -1.5 Screening Labs: done 05/2022.  Reviewed in [place].   reports that she has never smoked. She has been exposed to tobacco smoke. She has never used smokeless tobacco. She reports current alcohol use. She reports that she does not use drugs.  Past Medical History:  Diagnosis Date   HLD (hyperlipidemia)    Osteopenia    STD (sexually transmitted disease)    HSV   Urinary incontinence     Past Surgical History:  Procedure Laterality Date   BREAST EXCISIONAL BIOPSY Right    CESAREAN SECTION     COLONOSCOPY     NOVASURE ABLATION  10/06/2010   in office, had vasovagal response   PLATLET RICH PLASMA Right     Current Outpatient Medications  Medication Sig Dispense Refill   Ascorbic Acid (VITAMIN C PO) Take by mouth daily.     Black Cohosh 40 MG CAPS Take by mouth daily.     CALCIUM PO Take by mouth daily. ONE DAILY     gabapentin (NEURONTIN) 300 MG capsule Take 2 capsules nightly 180 capsule 0   glucosamine-chondroitin 500-400 MG tablet Take 1 tablet by mouth  daily.     Multiple Vitamins-Minerals (MULTIVITAMIN WOMEN 50+ PO) Take by mouth daily. ONE DAILY     OVER THE COUNTER MEDICATION daily. UN-FORGETABLE     rosuvastatin (CRESTOR) 10 MG tablet Take 1 tablet (10 mg total) by mouth daily. 90 tablet 1   TURMERIC PO Take by mouth daily.     UNABLE TO FIND daily. Med Name: estraval     ALPRAZolam (XANAX) 0.5 MG tablet Take 1 tablet (0.5 mg total) by mouth 2 (two) times daily as needed for anxiety. (Patient not taking: Reported on 08/16/2022) 30 tablet 1   No current facility-administered medications for this visit.    Family History  Problem Relation Age of Onset   Heart disease Paternal Grandfather    Heart disease Paternal Grandmother    Breast cancer Maternal Grandmother        dx. 34 or younger   Heart Problems Maternal Grandfather    Hyperlipidemia Father    Heart attack Father    Heart disease Father    Transient ischemic attack Father    Prostate cancer Father        dx. 82-71   Melanoma Father        dx. >71   Diverticulitis  Father    Hyperlipidemia Mother    Thyroid disease Mother    Osteoporosis Mother    Heart failure Mother    Colon cancer Sister        dx. 58-59   Breast cancer Sister 13       mastectomy   Brain cancer Maternal Uncle        unspecified type   Cirrhosis Maternal Uncle        +EtOH   Testicular cancer Maternal Uncle        dx. early 60s   Heart Problems Paternal Uncle    Lung cancer Cousin        d. late 19s; smoker   Testicular cancer Cousin 77   Melanoma Cousin 98   Sudden death Neg Hx    Hypertension Neg Hx    Diabetes Neg Hx    Crohn's disease Neg Hx    Esophageal cancer Neg Hx    Rectal cancer Neg Hx    Stomach cancer Neg Hx    Ulcerative colitis Neg Hx     ROS: Constitutional: negative Genitourinary:negative  Exam:   BP 103/71   Pulse 85   Ht '5\' 4"'$  (1.626 m)   Wt 104 lb 6.4 oz (47.4 kg)   BMI 17.92 kg/m   Height: '5\' 4"'$  (162.6 cm)  General appearance: alert, cooperative and  appears stated age Head: Normocephalic, without obvious abnormality, atraumatic Neck: no adenopathy, supple, symmetrical, trachea midline and thyroid normal to inspection and palpation Lungs: clear to auscultation bilaterally Breasts: normal appearance, no masses or tenderness Heart: regular rate and rhythm Abdomen: soft, non-tender; bowel sounds normal; no masses,  no organomegaly Extremities: extremities normal, atraumatic, no cyanosis or edema Skin: Skin color, texture, turgor normal. No rashes or lesions Lymph nodes: Cervical, supraclavicular, and axillary nodes normal. No abnormal inguinal nodes palpated Neurologic: Grossly normal   Pelvic: External genitalia:  no lesions              Urethra:  normal appearing urethra with no masses, tenderness or lesions              Bartholins and Skenes: normal                 Vagina: normal appearing vagina with normal color and no discharge, no lesions              Cervix: no lesions              Pap taken: Yes.   Bimanual Exam:  Uterus:  normal size, contour, position, consistency, mobility, non-tender              Adnexa: normal adnexa and no mass, fullness, tenderness               Rectovaginal: Confirms               Anus:  normal sphincter tone, no lesions  Chaperone, Ezekiel Ina, RN, was present for exam.  Assessment/Plan: 1. Well woman exam with routine gynecological exam - Pap smear only obtained today - Mammogram scheduled - Colonoscopy scheduled - Bone mineral density 2020, t score -1.5 - lab work done done with PCP - vaccines reviewed/updated  2. Hot flashes due to menopause - gabapentin (NEURONTIN) 300 MG capsule; Take 2 capsules nightly  Dispense: 180 capsule; Refill: 4  3. Family history of colon cancer  4. Family history of breast cancer in female

## 2022-08-20 ENCOUNTER — Encounter: Payer: Self-pay | Admitting: Gastroenterology

## 2022-08-20 LAB — CYTOLOGY - PAP
Adequacy: ABSENT
Comment: NEGATIVE
Diagnosis: NEGATIVE
High risk HPV: NEGATIVE

## 2022-08-21 ENCOUNTER — Ambulatory Visit
Admission: RE | Admit: 2022-08-21 | Discharge: 2022-08-21 | Disposition: A | Discharge status: Home / Self Care | Payer: No Typology Code available for payment source | Source: Ambulatory Visit | Attending: Family Medicine | Admitting: Family Medicine

## 2022-08-21 DIAGNOSIS — Z1231 Encounter for screening mammogram for malignant neoplasm of breast: Secondary | ICD-10-CM

## 2022-08-28 ENCOUNTER — Encounter: Payer: Self-pay | Admitting: Gastroenterology

## 2022-08-28 ENCOUNTER — Ambulatory Visit (AMBULATORY_SURGERY_CENTER): Payer: No Typology Code available for payment source | Admitting: Gastroenterology

## 2022-08-28 ENCOUNTER — Encounter (HOSPITAL_BASED_OUTPATIENT_CLINIC_OR_DEPARTMENT_OTHER): Payer: Self-pay | Admitting: Obstetrics & Gynecology

## 2022-08-28 ENCOUNTER — Ambulatory Visit (HOSPITAL_BASED_OUTPATIENT_CLINIC_OR_DEPARTMENT_OTHER): Payer: No Typology Code available for payment source | Admitting: Obstetrics & Gynecology

## 2022-08-28 VITALS — BP 99/51 | HR 74 | Temp 97.8°F | Resp 13 | Ht 63.0 in | Wt 105.0 lb

## 2022-08-28 DIAGNOSIS — Z1211 Encounter for screening for malignant neoplasm of colon: Secondary | ICD-10-CM | POA: Diagnosis not present

## 2022-08-28 DIAGNOSIS — Z09 Encounter for follow-up examination after completed treatment for conditions other than malignant neoplasm: Secondary | ICD-10-CM

## 2022-08-28 DIAGNOSIS — Z86010 Personal history of colonic polyps: Secondary | ICD-10-CM

## 2022-08-28 DIAGNOSIS — K648 Other hemorrhoids: Secondary | ICD-10-CM

## 2022-08-28 DIAGNOSIS — Z800 Family history of malignant neoplasm of digestive organs: Secondary | ICD-10-CM

## 2022-08-28 DIAGNOSIS — K644 Residual hemorrhoidal skin tags: Secondary | ICD-10-CM | POA: Diagnosis not present

## 2022-08-28 MED ORDER — SODIUM CHLORIDE 0.9 % IV SOLN: 500.0000 mL | Freq: Once | INTRAVENOUS | Status: DC

## 2022-08-28 NOTE — Progress Notes (Signed)
Report to pacu rn. Vss. Care resumed by rn. 

## 2022-08-28 NOTE — Op Note (Signed)
[facility] Patient Name: Amanda Evans Procedure Date: 08/28/2022 9:41 AM MRN: [id number] Endoscopist: Mauri Pole , MD Age: 61 Referring MD:  Date of Birth: 11-30-1960 Gender: Female Account #: [id number] Procedure:                Colonoscopy Indications:              Screening in patient at increased risk: Family                            history of 1st-degree relative with colorectal                            cancer before age 58 years Medicines:                Monitored Anesthesia Care Procedure:                Pre-Anesthesia Assessment:                           - Prior to the procedure, a History and Physical                            was performed, and patient medications and                            allergies were reviewed. The patient's tolerance of                            previous anesthesia was also reviewed. The risks                            and benefits of the procedure and the sedation                            options and risks were discussed with the patient.                            All questions were answered, and informed consent                            was obtained. Prior Anticoagulants: The patient has                            taken no previous anticoagulant or antiplatelet                            agents. ASA Grade Assessment: II - A patient with                            mild systemic disease. After reviewing the risks                            and benefits, the patient was deemed in  satisfactory condition to undergo the procedure.                           After obtaining informed consent, the colonoscope                            was passed under direct vision. Throughout the                            procedure, the patient's blood pressure, pulse, and                            oxygen saturations were monitored continuously. The                            PCF-HQ190L Colonoscope was  introduced through the                            anus and advanced to the the cecum, identified by                            appendiceal orifice and ileocecal valve. The                            colonoscopy was performed without difficulty. The                            patient tolerated the procedure well. The quality                            of the bowel preparation was good. The ileocecal                            valve, appendiceal orifice, and rectum were                            photographed. Scope In: 9:55:49 AM Scope Out: 10:18:44 AM Scope Withdrawal Time: 0 hours 7 minutes 5 seconds  Total Procedure Duration: 0 hours 22 minutes 55 seconds  Findings:                 The perianal and digital rectal examinations were                            normal.                           Non-bleeding external and internal hemorrhoids were                            found during retroflexion. The hemorrhoids were                            small.  The exam was otherwise without abnormality. Complications:            No immediate complications. Estimated Blood Loss:     Estimated blood loss: none. Impression:               - Non-bleeding external and internal hemorrhoids.                           - The examination was otherwise normal.                           - No specimens collected. Recommendation:           - Patient has a contact number available for                            emergencies. The signs and symptoms of potential                            delayed complications were discussed with the                            patient. Return to normal activities tomorrow.                            Written discharge instructions were provided to the                            patient.                           - Resume previous diet.                           - Continue present medications.                           - Repeat colonoscopy in 5 years for  surveillance. Mauri Pole, MD 08/28/2022 10:25:59 AM This report has been signed electronically.

## 2022-08-28 NOTE — Progress Notes (Signed)
VS completed by DT.  Pt's states no medical or surgical changes since previsit or office visit.  

## 2022-08-28 NOTE — Progress Notes (Signed)
[facility] History and Physical   Primary Care Physician:  Midge Minium, MD   Reason for Procedure:  Family history of colon cancer  Plan:    Screening colonoscopy with possible interventions as needed     HPI: Amanda Evans is a very pleasant 61 y.o. female here for screening colonoscopy. Denies any nausea, vomiting, abdominal pain, melena or bright red blood per rectum  The risks and benefits as well as alternatives of endoscopic procedure(s) have been discussed and reviewed. All questions answered. The patient agrees to proceed.    Past Medical History:  Diagnosis Date   HLD (hyperlipidemia)    Osteopenia    STD (sexually transmitted disease)    HSV   Urinary incontinence     Past Surgical History:  Procedure Laterality Date   BREAST EXCISIONAL BIOPSY Right    CESAREAN SECTION     COLONOSCOPY     NOVASURE ABLATION  10/06/2010   in office, had vasovagal response   PLATLET RICH PLASMA Right     Prior to Admission medications   Medication Sig Start Date End Date Taking? Authorizing Provider  Ascorbic Acid (VITAMIN C PO) Take by mouth daily.   Yes [provider]  Black Cohosh 40 MG CAPS Take by mouth daily.   Yes [provider]  CALCIUM PO Take by mouth daily. ONE DAILY   Yes [provider]  estradiol (ESTRACE) 0.1 MG/GM vaginal cream insert 1 gram vaginally twice weekly 08/16/22  Yes Megan Salon, MD  gabapentin (NEURONTIN) 300 MG capsule Take 2 capsules nightly 08/16/22 08/28/22 Yes Megan Salon, MD  glucosamine-chondroitin 500-400 MG tablet Take 1 tablet by mouth daily.   Yes [provider]  Multiple Vitamins-Minerals (MULTIVITAMIN WOMEN 50+ PO) Take by mouth daily. ONE DAILY   Yes [provider]  OVER THE COUNTER MEDICATION daily. UN-FORGETABLE   Yes [provider]  rosuvastatin (CRESTOR) 10 MG tablet Take 1 tablet (10 mg total) by mouth daily. 07/17/22  Yes Fay Records, MD   TURMERIC PO Take by mouth daily.   Yes [provider]  UNABLE TO FIND daily. Med Name: estraval   Yes [provider]  ALPRAZolam Duanne Moron) 0.5 MG tablet Take 1 tablet (0.5 mg total) by mouth 2 (two) times daily as needed for anxiety. Patient not taking: Reported on 08/16/2022 05/03/22   Midge Minium, MD  nitrofurantoin, macrocrystal-monohydrate, (MACROBID) 100 MG capsule Take 1 capsule daily as needed for UTI prophylaxis 08/16/22   Megan Salon, MD    Current Outpatient Medications  Medication Sig Dispense Refill   Ascorbic Acid (VITAMIN C PO) Take by mouth daily.     Black Cohosh 40 MG CAPS Take by mouth daily.     CALCIUM PO Take by mouth daily. ONE DAILY     estradiol (ESTRACE) 0.1 MG/GM vaginal cream insert 1 gram vaginally twice weekly 42.5 g 3   gabapentin (NEURONTIN) 300 MG capsule Take 2 capsules nightly 180 capsule 4   glucosamine-chondroitin 500-400 MG tablet Take 1 tablet by mouth daily.     Multiple Vitamins-Minerals (MULTIVITAMIN WOMEN 50+ PO) Take by mouth daily. ONE DAILY     OVER THE COUNTER MEDICATION daily. UN-FORGETABLE     rosuvastatin (CRESTOR) 10 MG tablet Take 1 tablet (10 mg total) by mouth daily. 90 tablet 1   TURMERIC PO Take by mouth daily.     UNABLE TO FIND daily. Med Name: estraval     ALPRAZolam Duanne Moron) 0.5  MG tablet Take 1 tablet (0.5 mg total) by mouth 2 (two) times daily as needed for anxiety. (Patient not taking: Reported on 08/16/2022) 30 tablet 1   nitrofurantoin, macrocrystal-monohydrate, (MACROBID) 100 MG capsule Take 1 capsule daily as needed for UTI prophylaxis 30 capsule 2   Current Facility-Administered Medications  Medication Dose Route Frequency Provider Last Rate Last Admin   0.9 %  sodium chloride infusion  500 mL Intravenous Once Mauri Pole, MD        Allergies as of 08/28/2022 - Review Complete 08/28/2022  Allergen Reaction Noted   Naproxen sodium  08/17/2010   Penicillins  08/24/2009    Family  History  Problem Relation Age of Onset   Heart disease Paternal Grandfather    Heart disease Paternal Grandmother    Breast cancer Maternal Grandmother        dx. 41 or younger   Heart Problems Maternal Grandfather    Hyperlipidemia Father    Heart attack Father    Heart disease Father    Transient ischemic attack Father    Prostate cancer Father        dx. 44-71   Melanoma Father        dx. >71   Diverticulitis Father    Hyperlipidemia Mother    Thyroid disease Mother    Osteoporosis Mother    Heart failure Mother    Colon cancer Sister        dx. 58-59   Breast cancer Sister 46       mastectomy   Brain cancer Maternal Uncle        unspecified type   Cirrhosis Maternal Uncle        +EtOH   Testicular cancer Maternal Uncle        dx. early 60s   Heart Problems Paternal Uncle    Lung cancer Cousin        d. late 4s; smoker   Testicular cancer Cousin 2   Melanoma Cousin 26   Sudden death Neg Hx    Hypertension Neg Hx    Diabetes Neg Hx    Crohn's disease Neg Hx    Esophageal cancer Neg Hx    Rectal cancer Neg Hx    Stomach cancer Neg Hx    Ulcerative colitis Neg Hx     Social History   Socioeconomic History   Marital status: Married    Spouse name: Not on file   Number of children: 1   Years of education: Not on file   Highest education level: Not on file  Occupational History   Occupation: GRAPHIC DESIGNER    Employer: [employer]  Tobacco Use   Smoking status: Never    Passive exposure: Past (FATHER SMOKED AS A CHILD)   Smokeless tobacco: Never  Vaping Use   Vaping Use: Never used  Substance and Sexual Activity   Alcohol use: Yes    Alcohol/week: 0.0 - 1.0 standard drinks of alcohol    Comment: OCCASSIONALLY   Drug use: No   Sexual activity: Yes    Birth control/protection: None    Comment: vasectomy  Other Topics Concern   Not on file  Social History Narrative   Not on file   Social Determinants of Health   Financial Resource Strain: Not  on file  Food Insecurity: Not on file  Transportation Needs: Not on file  Physical Activity: Not on file  Stress: Not on file  Social Connections: Not on file  Intimate Partner Violence:  Not on file    Review of Systems:  All other review of systems negative except as mentioned in the HPI.  Physical Exam: Vital signs in last 24 hours: Blood Pressure 118/67   Pulse 72   Temperature 97.8 F (36.6 C) (Temporal)   Height '5\' 3"'$  (1.6 m)   Weight 105 lb (47.6 kg)   Oxygen Saturation 99%   Body Mass Index 18.60 kg/m  General:   Alert, NAD Lungs:  Clear .   Heart:  Regular rate and rhythm Abdomen:  Soft, nontender and nondistended. Neuro/Psych:  Alert and cooperative. Normal mood and affect. A and O x 3  Reviewed labs, radiology imaging, old records and pertinent past GI work up  Patient is appropriate for planned procedure(s) and anesthesia in an ambulatory setting   K. Denzil Magnuson , MD [phone]

## 2022-08-28 NOTE — Patient Instructions (Addendum)
HANDOUTS PROVIDED ON: HEMORRHOIDS  Your next colonoscopy should occur in 5 years.    You may resume your previous diet and medication schedule.  Thank you for allowing [place] to care for you today!!!   YOU HAD AN ENDOSCOPIC PROCEDURE TODAY AT [facility] [facility]:   Refer to the procedure report that was given to you for any specific questions about what was found during the examination.  If the procedure report does not answer your questions, please call your gastroenterologist to clarify.  If you requested that your care partner not be given the details of your procedure findings, then the procedure report has been included in a sealed envelope for you to review at your convenience later.  YOU SHOULD EXPECT: Some feelings of bloating in the abdomen. Passage of more gas than usual.  Walking can help get rid of the air that was put into your GI tract during the procedure and reduce the bloating. If you had a lower endoscopy (such as a colonoscopy or flexible sigmoidoscopy) you may notice spotting of blood in your stool or on the toilet paper. If you underwent a bowel prep for your procedure, you may not have a normal bowel movement for a few days.  Please Note:  You might notice some irritation and congestion in your nose or some drainage.  This is from the oxygen used during your procedure.  There is no need for concern and it should clear up in a day or so.  SYMPTOMS TO REPORT IMMEDIATELY:  Following lower endoscopy (colonoscopy or flexible sigmoidoscopy):  Excessive amounts of blood in the stool  Significant tenderness or worsening of abdominal pains  Swelling of the abdomen that is new, acute  Fever of 100F or higher  For urgent or emergent issues, a gastroenterologist can be reached at any hour by calling [phone]. Do not use MyChart messaging for urgent concerns.    DIET:  We do recommend a small meal at first, but then you may proceed to your regular diet.  Drink plenty  of fluids but you should avoid alcoholic beverages for 24 hours.  ACTIVITY:  You should plan to take it easy for the rest of today and you should NOT DRIVE or use heavy machinery until tomorrow (because of the sedation medicines used during the test).    FOLLOW UP: Our staff will call the number listed on your records the next business day following your procedure.  We will call around 7:15- 8:00 am to check on you and address any questions or concerns that you may have regarding the information given to you following your procedure. If we do not reach you, we will leave a message.     If any biopsies were taken you will be contacted by phone or by letter within the next 1-3 weeks.  Please call us at [phone] if you have not heard about the biopsies in 3 weeks.    SIGNATURES/CONFIDENTIALITY: You and/or your care partner have signed paperwork which will be entered into your electronic medical record.  These signatures attest to the fact that that the information above on your After Visit Summary has been reviewed and is understood.  Full responsibility of the confidentiality of this discharge information lies with you and/or your care-partner.

## 2022-08-29 ENCOUNTER — Telehealth: Payer: Self-pay

## 2022-08-29 NOTE — Telephone Encounter (Signed)
  Follow up Call-     08/28/2022    8:46 AM  Call back number  Post procedure Call Back phone  # [phone]  Permission to leave phone message Yes     Patient questions:  Do you have a fever, pain , or abdominal swelling? No. Pain Score  0 *  Have you tolerated food without any problems? Yes.    Have you been able to return to your normal activities? Yes.    Do you have any questions about your discharge instructions: Diet   No. Medications  No. Follow up visit  No.  Do you have questions or concerns about your Care? No.  Actions: * If pain score is 4 or above: No action needed, pain <4.

## 2022-09-10 ENCOUNTER — Other Ambulatory Visit (HOSPITAL_BASED_OUTPATIENT_CLINIC_OR_DEPARTMENT_OTHER): Payer: Self-pay

## 2022-10-18 ENCOUNTER — Encounter (HOSPITAL_BASED_OUTPATIENT_CLINIC_OR_DEPARTMENT_OTHER): Payer: Self-pay | Admitting: Pharmacist

## 2022-10-18 ENCOUNTER — Other Ambulatory Visit (HOSPITAL_BASED_OUTPATIENT_CLINIC_OR_DEPARTMENT_OTHER): Payer: Self-pay

## 2022-10-18 ENCOUNTER — Other Ambulatory Visit: Payer: Self-pay

## 2022-11-06 ENCOUNTER — Other Ambulatory Visit (HOSPITAL_BASED_OUTPATIENT_CLINIC_OR_DEPARTMENT_OTHER): Payer: Self-pay

## 2022-11-22 ENCOUNTER — Telehealth: Payer: Self-pay

## 2022-11-22 ENCOUNTER — Encounter: Payer: Self-pay | Admitting: Family Medicine

## 2022-11-22 ENCOUNTER — Ambulatory Visit (INDEPENDENT_AMBULATORY_CARE_PROVIDER_SITE_OTHER): Payer: 59 | Admitting: Family Medicine

## 2022-11-22 VITALS — BP 110/70 | HR 90 | Temp 98.8°F | Resp 17 | Ht 63.0 in | Wt 108.0 lb

## 2022-11-22 DIAGNOSIS — E785 Hyperlipidemia, unspecified: Secondary | ICD-10-CM | POA: Diagnosis not present

## 2022-11-22 LAB — HEPATIC FUNCTION PANEL
ALT: 18 U/L (ref 0–35)
AST: 20 U/L (ref 0–37)
Albumin: 4.8 g/dL (ref 3.5–5.2)
Alkaline Phosphatase: 50 U/L (ref 39–117)
Bilirubin, Direct: 0.1 mg/dL (ref 0.0–0.3)
Total Bilirubin: 0.5 mg/dL (ref 0.2–1.2)
Total Protein: 7 g/dL (ref 6.0–8.3)

## 2022-11-22 LAB — BASIC METABOLIC PANEL
BUN: 18 mg/dL (ref 6–23)
CO2: 31 mEq/L (ref 19–32)
Calcium: 9.7 mg/dL (ref 8.4–10.5)
Chloride: 103 mEq/L (ref 96–112)
Creatinine, Ser: 0.8 mg/dL (ref 0.40–1.20)
GFR: 80.42 mL/min (ref >60.00)
Glucose, Bld: 93 mg/dL (ref 70–99)
Potassium: 4.4 mEq/L (ref 3.5–5.1)
Sodium: 141 mEq/L (ref 135–145)

## 2022-11-22 LAB — LIPID PANEL
Cholesterol: 178 mg/dL (ref 0–200)
HDL: 79.9 mg/dL (ref >39.00)
LDL Cholesterol: 88 mg/dL (ref 0–99)
NonHDL: 98.1
Total CHOL/HDL Ratio: 2
Triglycerides: 50 mg/dL (ref 0.0–149.0)
VLDL: 10 mg/dL (ref 0.0–40.0)

## 2022-11-22 NOTE — Telephone Encounter (Signed)
-----  Message from Midge Minium, MD sent at 11/22/2022  4:04 PM EST ----- Labs look good!  No changes at this time

## 2022-11-22 NOTE — Assessment & Plan Note (Signed)
Chronic problem.  Tolerating Crestor '10mg'$  daily w/o difficulty.  Check labs.  Adjust meds prn

## 2022-11-22 NOTE — Progress Notes (Signed)
   Subjective:    Patient ID: Amanda Evans, female    DOB: September 28, 1961, 62 y.o.   MRN: [id number]  HPI Hyperlipidemia- chronic problem, last LDL 77 on Crestor '10mg'$  daily.  Pt reports feeling good.  Denies CP, abd pain, N/V.  No regular exercise currently.     Review of Systems For ROS see HPI     Objective:   Physical Exam Vitals reviewed.  Constitutional:      General: She is not in acute distress.    Appearance: Normal appearance. She is well-developed. She is not ill-appearing.  HENT:     Head: Normocephalic and atraumatic.  Eyes:     Conjunctiva/sclera: Conjunctivae normal.     Pupils: Pupils are equal, round, and reactive to light.  Neck:     Thyroid: No thyromegaly.  Cardiovascular:     Rate and Rhythm: Normal rate and regular rhythm.     Heart sounds: Normal heart sounds. No murmur heard. Pulmonary:     Effort: Pulmonary effort is normal. No respiratory distress.     Breath sounds: Normal breath sounds.  Abdominal:     General: There is no distension.     Palpations: Abdomen is soft.     Tenderness: There is no abdominal tenderness.  Musculoskeletal:     Cervical back: Normal range of motion and neck supple.  Lymphadenopathy:     Cervical: No cervical adenopathy.  Skin:    General: Skin is warm and dry.  Neurological:     General: No focal deficit present.     Mental Status: She is alert and oriented to person, place, and time.  Psychiatric:        Behavior: Behavior normal.           Assessment & Plan:

## 2022-11-22 NOTE — Patient Instructions (Signed)
Schedule your complete physical in 6 months We'll notify you of your lab results and make any changes if needed Keep up the good work!  You look great! Call with any questions or concerns Stay Safe!  Stay Healthy! Happy New Year!!!

## 2022-11-22 NOTE — Telephone Encounter (Signed)
Informed pt of lab results  

## 2022-12-05 ENCOUNTER — Other Ambulatory Visit (HOSPITAL_BASED_OUTPATIENT_CLINIC_OR_DEPARTMENT_OTHER): Payer: Self-pay | Admitting: Obstetrics & Gynecology

## 2022-12-05 ENCOUNTER — Other Ambulatory Visit (HOSPITAL_BASED_OUTPATIENT_CLINIC_OR_DEPARTMENT_OTHER): Payer: Self-pay

## 2022-12-05 ENCOUNTER — Encounter (HOSPITAL_BASED_OUTPATIENT_CLINIC_OR_DEPARTMENT_OTHER): Payer: Self-pay | Admitting: Obstetrics & Gynecology

## 2022-12-05 DIAGNOSIS — N951 Menopausal and female climacteric states: Secondary | ICD-10-CM

## 2022-12-05 MED ORDER — GABAPENTIN 100 MG PO CAPS
ORAL_CAPSULE | ORAL | 1 refills | Status: DC
  Filled 2022-12-05: qty 60, 30d supply, fill #0
  Filled 2023-04-23: qty 60, 30d supply, fill #1

## 2023-01-14 ENCOUNTER — Other Ambulatory Visit (HOSPITAL_BASED_OUTPATIENT_CLINIC_OR_DEPARTMENT_OTHER): Payer: Self-pay

## 2023-01-14 ENCOUNTER — Other Ambulatory Visit: Payer: Self-pay | Admitting: Family Medicine

## 2023-01-14 ENCOUNTER — Other Ambulatory Visit: Payer: Self-pay | Admitting: Internal Medicine

## 2023-01-14 MED ORDER — ROSUVASTATIN CALCIUM 10 MG PO TABS
10.0000 mg | ORAL_TABLET | Freq: Every day | ORAL | 1 refills | Status: DC
  Filled 2023-01-14 – 2023-01-23 (×2): qty 90, 90d supply, fill #0
  Filled 2023-04-23: qty 90, 90d supply, fill #1

## 2023-01-21 ENCOUNTER — Other Ambulatory Visit (HOSPITAL_BASED_OUTPATIENT_CLINIC_OR_DEPARTMENT_OTHER): Payer: Self-pay

## 2023-01-22 ENCOUNTER — Other Ambulatory Visit (HOSPITAL_BASED_OUTPATIENT_CLINIC_OR_DEPARTMENT_OTHER): Payer: Self-pay

## 2023-01-23 ENCOUNTER — Other Ambulatory Visit (HOSPITAL_BASED_OUTPATIENT_CLINIC_OR_DEPARTMENT_OTHER): Payer: Self-pay

## 2023-02-26 ENCOUNTER — Other Ambulatory Visit (HOSPITAL_BASED_OUTPATIENT_CLINIC_OR_DEPARTMENT_OTHER): Payer: Self-pay

## 2023-02-28 IMAGING — CT CT CARDIAC CORONARY ARTERY CALCIUM SCORE
3 series · 14 of 20 positions shown, 16 images · non-contrast
Comparison: None.
COMPARISON: None.

Addendum:
EXAM:
OVER-READ INTERPRETATION  CT CHEST

The following report is an over-read performed by radiologist Dr.
Blain Jumper [REDACTED] on 06/20/2021. This over-read
does not include interpretation of cardiac or coronary anatomy or
pathology. The coronary calcium score interpretation by the
cardiologist is attached.
CLINICAL DATA: Cardiovascular Disease Risk stratification
Coronary Calcium Score
TECHNIQUE: A gated, non-contrast computed tomography scan of the heart was
performed using 3mm slice thickness. Axial images were analyzed on a
dedicated workstation. Calcium scoring of the coronary arteries was
performed using the Agatston method.

[Series 2: cascseq 2.0 sa36 70% (id) · axial · 0.39mm/px · z∈[-249,-159]mm · 4 of 76 slices shown]
[im 16/76  vessel]
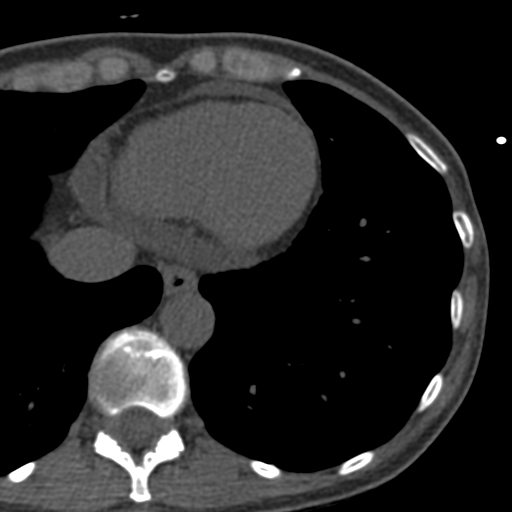
[im 31/76  vessel]
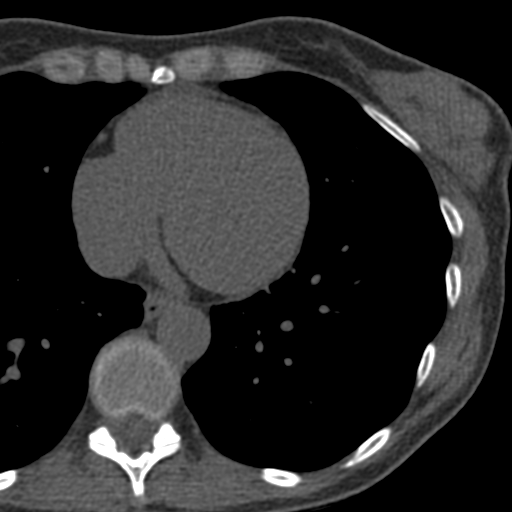
[im 46/76  vessel]
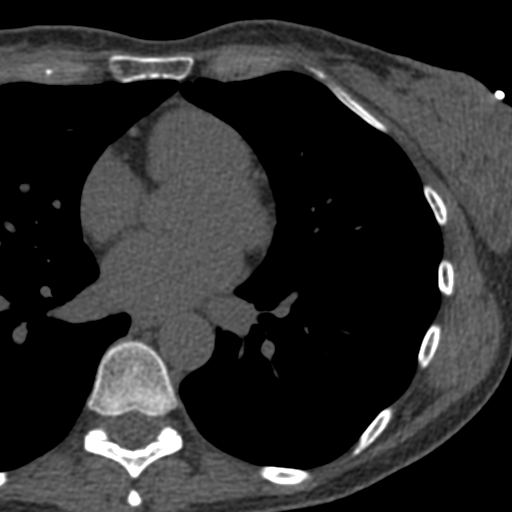
[im 61/76  vessel]
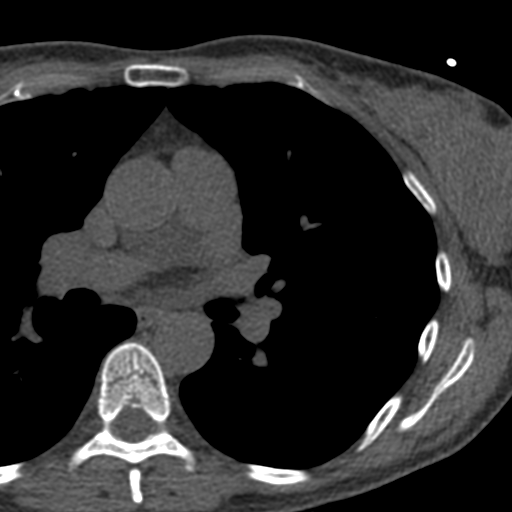

[Series 3: cascseq 2.0 bf37 st · axial · 0.66mm/px · z∈[-255,-155]mm · 5 of 76 slices shown, 7 images]
[im 13/76  vessel]
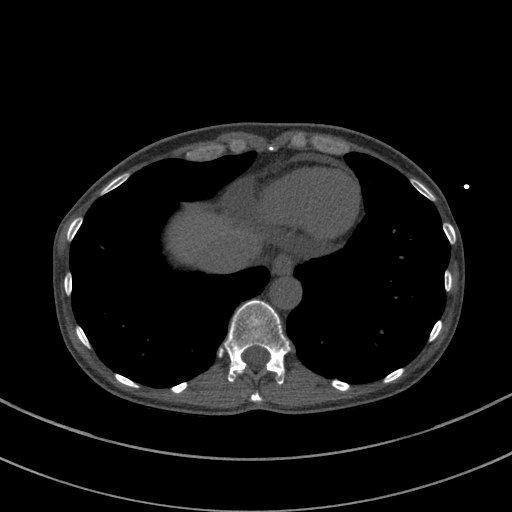
[im 13/76  lung]
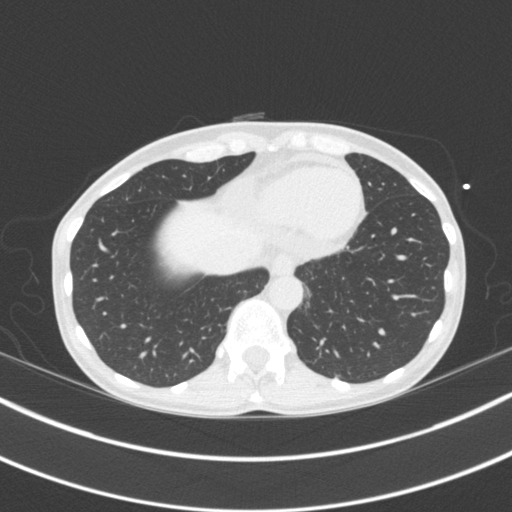
[im 26/76  vessel]
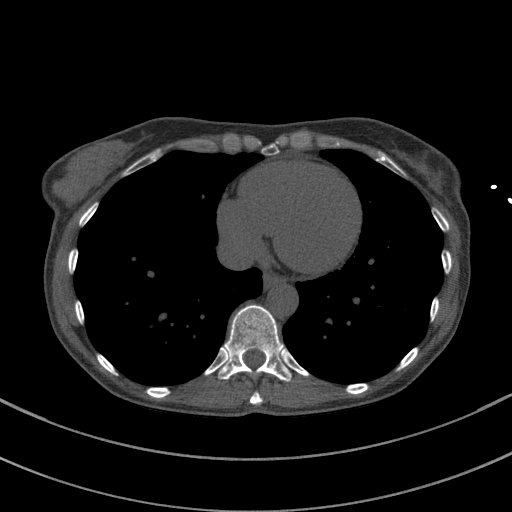
[im 38/76  vessel]
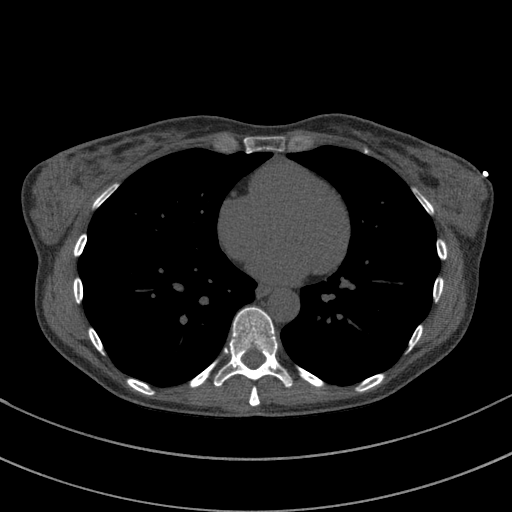
[im 51/76  vessel]
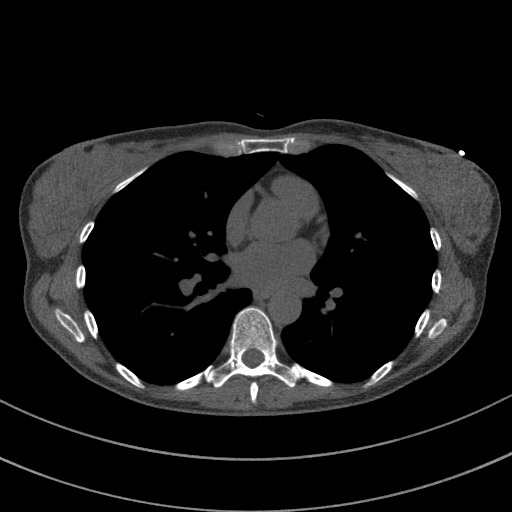
[im 63/76  vessel]
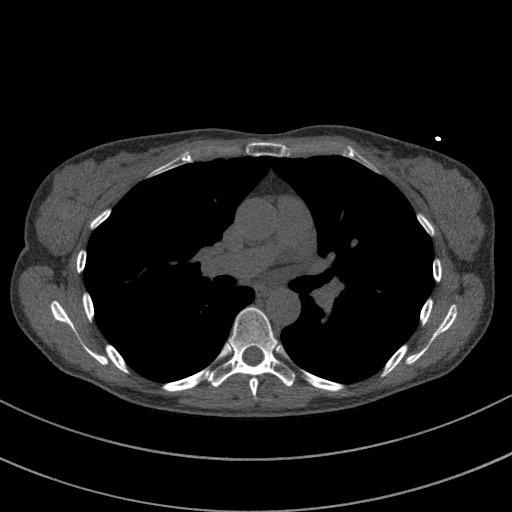
[im 63/76  lung]
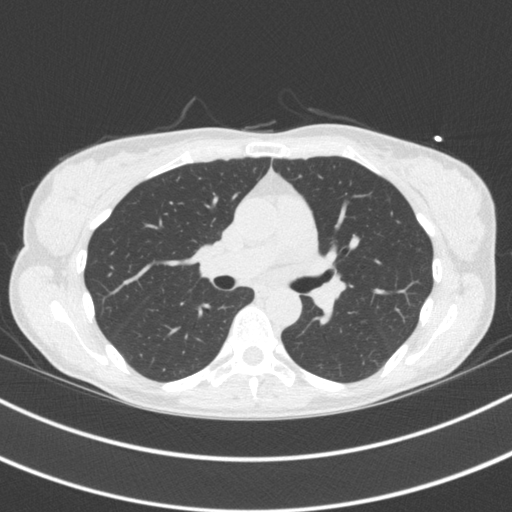

[Series 4: cascseq 2.0 br59 lung · axial · 0.66mm/px · z∈[-255,-155]mm · 5 of 76 slices shown]
[im 13/76  lung]
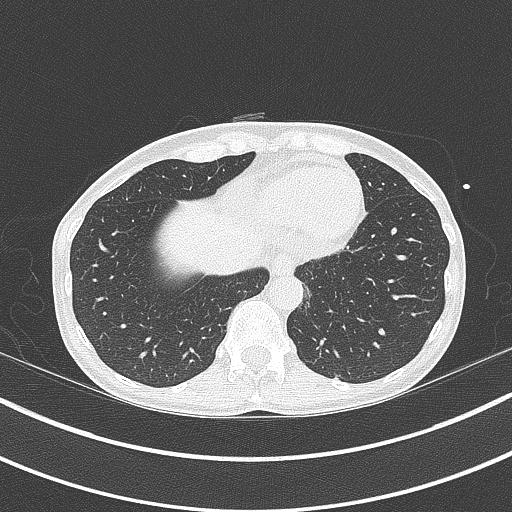
[im 26/76  lung]
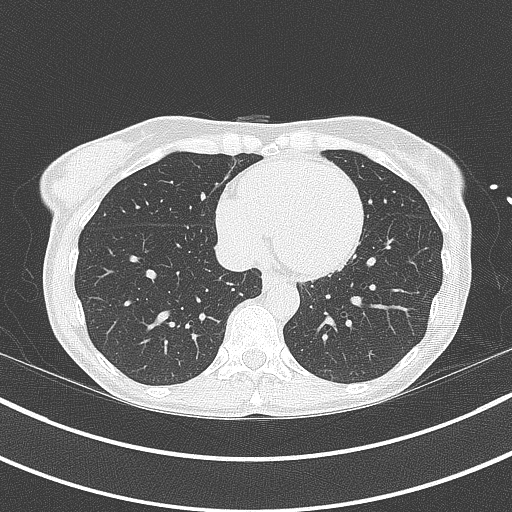
[im 38/76  lung]
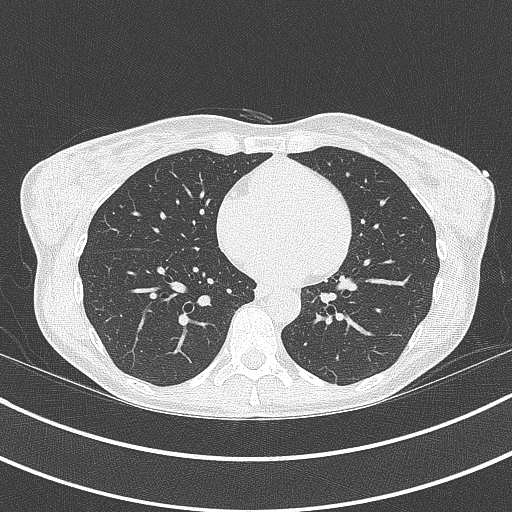
[im 51/76  lung]
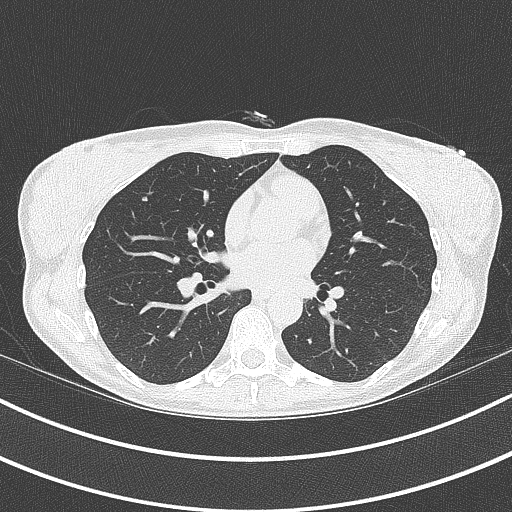
[im 63/76  lung]
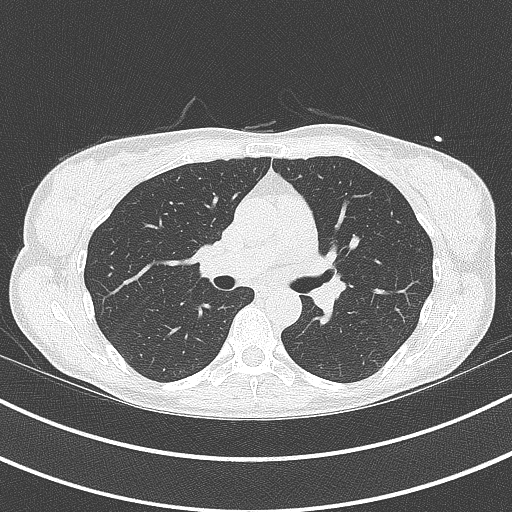

[14 of 20 positions shown; findings below may reference images not displayed]

FINDINGS: Vascular: Heart is normal size. Aorta normal caliber. Small
pericardial effusion, likely physiologic.

Mediastinum/Nodes: No adenopathy

Lungs/Pleura: No confluent opacities or effusions.

Upper Abdomen: Imaging into the upper abdomen demonstrates no acute
findings.

Musculoskeletal: Chest wall soft tissues are unremarkable. No acute
bony abnormality.
IMPRESSION: No acute or significant extracardiac abnormality.
FINDINGS: Coronary arteries: Normal origins.

Coronary Calcium Score:

Left main: 0

Left anterior descending artery: 0

Left circumflex artery: 0

Right coronary artery: 0

Total: 0

Percentile: 0

Pericardium: Normal.

Ascending Aorta: Normal caliber.

Non-cardiac: See separate report from [REDACTED].
IMPRESSION: Coronary calcium score of 0. This was 0 percentile for age-, race-,
and sex-matched controls.



If CAC=0, it is reasonable to withhold statin therapy and reassess
in 5 to 10 years, as long as higher risk conditions are absent
(diabetes mellitus, family history of premature CHD in first degree
relatives (males <55 years; females <65 years), cigarette smoking,
or LDL >=190 mg/dL).

If CAC is 1 to 99, it is reasonable to initiate statin therapy for
patients >=55 years of age.

If CAC is >=100 or >=75th percentile, it is reasonable to initiate
statin therapy at any age.

Cardiology referral should be considered for patients with CAC
scores >=400 or >=75th percentile.

*8005 AHA/ACC/AACVPR/AAPA/ABC/TURPING/MMAKENG/JIM/Denjiz/DU BRUYN/KASTRAT/EMME
Guideline on the Management of Blood Cholesterol: A Report of the
American College of Cardiology/American Heart Association Task Force
on Clinical Practice Guidelines. J Am Coll Cardiol.
9797;73(24):3980-3623.

*** End of Addendum ***
EXAM:
OVER-READ INTERPRETATION  CT CHEST

The following report is an over-read performed by radiologist Dr.
Blain Jumper [REDACTED] on 06/20/2021. This over-read
does not include interpretation of cardiac or coronary anatomy or
pathology. The coronary calcium score interpretation by the
cardiologist is attached.
FINDINGS: Vascular: Heart is normal size. Aorta normal caliber. Small
pericardial effusion, likely physiologic.

Mediastinum/Nodes: No adenopathy

Lungs/Pleura: No confluent opacities or effusions.

Upper Abdomen: Imaging into the upper abdomen demonstrates no acute
findings.

Musculoskeletal: Chest wall soft tissues are unremarkable. No acute
bony abnormality.
IMPRESSION: No acute or significant extracardiac abnormality.

## 2023-05-24 ENCOUNTER — Encounter: Payer: Self-pay | Admitting: Family Medicine

## 2023-05-24 ENCOUNTER — Ambulatory Visit (INDEPENDENT_AMBULATORY_CARE_PROVIDER_SITE_OTHER): Payer: 59 | Admitting: Family Medicine

## 2023-05-24 VITALS — BP 108/74 | HR 73 | Temp 98.2°F | Resp 17 | Ht 63.0 in | Wt 106.1 lb

## 2023-05-24 DIAGNOSIS — E785 Hyperlipidemia, unspecified: Secondary | ICD-10-CM

## 2023-05-24 DIAGNOSIS — Z23 Encounter for immunization: Secondary | ICD-10-CM | POA: Diagnosis not present

## 2023-05-24 DIAGNOSIS — Z0000 Encounter for general adult medical examination without abnormal findings: Secondary | ICD-10-CM

## 2023-05-24 NOTE — Patient Instructions (Signed)
Schedule your complete physical in 1 year We'll notify you of your lab results and make any changes if needed Keep up the good work!  You look great! Call with any questions or concerns Stay Safe!  Stay Healthy! Have a great summer!!!

## 2023-05-24 NOTE — Assessment & Plan Note (Signed)
Pt's PE WNL.  UTD on pap, mammo, colonoscopy.  Tdap updated.  Check labs.  Anticipatory guidance provided.

## 2023-05-24 NOTE — Progress Notes (Signed)
   Subjective:    Patient ID: Amanda Evans, female    DOB: 05/14/1961, 62 y.o.   MRN: [id number]  HPI CPE- UTD on mammo, pap, colonoscopy.  Due for Tdap  Patient Care Team    Relationship Specialty Notifications Start End  Sheliah Hatch, MD PCP - General Family Medicine  11/20/21   Monica Becton, MD Consulting Physician Sports Medicine  05/27/17   Jerene Bears, MD Consulting Physician Gynecology  11/02/20   Pricilla Riffle, MD Consulting Physician Cardiology  11/20/21   Napoleon Form, MD Consulting Physician Gastroenterology  11/20/21      Health Maintenance  Topic Date Due   DTaP/Tdap/Td (2 - Td or Tdap) 05/06/2023   INFLUENZA VACCINE  06/06/2023   MAMMOGRAM  08/22/2023   PAP SMEAR-Modifier  08/16/2025   Colonoscopy  08/29/2027   Hepatitis C Screening  Completed   HIV Screening  Completed   Zoster Vaccines- Shingrix  Completed   HPV VACCINES  Aged Out   COVID-19 Vaccine  Discontinued      Review of Systems Patient reports no vision/ hearing changes, adenopathy,fever, weight change,  persistant/recurrent hoarseness , swallowing issues, chest pain, palpitations, edema, persistant/recurrent cough, hemoptysis, dyspnea (rest/exertional/paroxysmal nocturnal), gastrointestinal bleeding (melena, rectal bleeding), abdominal pain, significant heartburn, bowel changes, GU symptoms (dysuria, hematuria, incontinence), Gyn symptoms (abnormal  bleeding, pain),  syncope, focal weakness, memory loss, numbness & tingling, skin/hair/nail changes, abnormal bruising or bleeding, anxiety, or depression.     Objective:   Physical Exam General Appearance:    Alert, cooperative, no distress, appears stated age  Head:    Normocephalic, without obvious abnormality, atraumatic  Eyes:    PERRL, conjunctiva/corneas clear, EOM's intact both eyes  Ears:    Normal TM's and external ear canals, both ears  Nose:   Nares normal, septum midline, mucosa normal, no drainage    or sinus  tenderness  Throat:   Lips, mucosa, and tongue normal; teeth and gums normal  Neck:   Supple, symmetrical, trachea midline, no adenopathy;    Thyroid: no enlargement/tenderness/nodules  Back:     Symmetric, no curvature, ROM normal, no CVA tenderness  Lungs:     Clear to auscultation bilaterally, respirations unlabored  Chest Wall:    No tenderness or deformity   Heart:    Regular rate and rhythm, S1 and S2 normal, no murmur, rub   or gallop  Breast Exam:    Deferred to GYN  Abdomen:     Soft, non-tender, bowel sounds active all four quadrants,    no masses, no organomegaly  Genitalia:    Deferred to GYN  Rectal:    Extremities:   Extremities normal, atraumatic, no cyanosis or edema  Pulses:   2+ and symmetric all extremities  Skin:   Skin color, texture, turgor normal, no rashes or lesions  Lymph nodes:   Cervical, supraclavicular, and axillary nodes normal  Neurologic:   CNII-XII intact, normal strength, sensation and reflexes    throughout          Assessment & Plan:

## 2023-05-25 LAB — CBC WITH DIFFERENTIAL/PLATELET
Absolute Monocytes: 375 cells/uL (ref 200–950)
Basophils Absolute: 50 cells/uL (ref 0–200)
Basophils Relative: 0.9 %
Eosinophils Absolute: 90 cells/uL (ref 15–500)
Eosinophils Relative: 1.6 %
HCT: 45.2 % — ABNORMAL HIGH (ref 35.0–45.0)
Hemoglobin: 15 g/dL (ref 11.7–15.5)
Lymphs Abs: 2688 cells/uL (ref 850–3900)
MCH: 30.3 pg (ref 27.0–33.0)
MCHC: 33.2 g/dL (ref 32.0–36.0)
MCV: 91.3 fL (ref 80.0–100.0)
MPV: 9.5 fL (ref 7.5–12.5)
Monocytes Relative: 6.7 %
Neutro Abs: 2397 cells/uL (ref 1500–7800)
Neutrophils Relative %: 42.8 %
Platelets: 234 10*3/uL (ref 140–400)
RBC: 4.95 10*6/uL (ref 3.80–5.10)
RDW: 12.8 % (ref 11.0–15.0)
Total Lymphocyte: 48 %
WBC: 5.6 10*3/uL (ref 3.8–10.8)

## 2023-05-25 LAB — HEPATIC FUNCTION PANEL
AG Ratio: 2.3 (calc) (ref 1.0–2.5)
ALT: 17 U/L (ref 6–29)
AST: 17 U/L (ref 10–35)
Albumin: 4.8 g/dL (ref 3.6–5.1)
Alkaline phosphatase (APISO): 54 U/L (ref 37–153)
Bilirubin, Direct: 0.1 mg/dL (ref 0.0–0.2)
Globulin: 2.1 g/dL (calc) (ref 1.9–3.7)
Indirect Bilirubin: 0.4 mg/dL (calc) (ref 0.2–1.2)
Total Bilirubin: 0.5 mg/dL (ref 0.2–1.2)
Total Protein: 6.9 g/dL (ref 6.1–8.1)

## 2023-05-25 LAB — BASIC METABOLIC PANEL
BUN: 20 mg/dL (ref 7–25)
CO2: 25 mmol/L (ref 20–32)
Calcium: 9.6 mg/dL (ref 8.6–10.4)
Chloride: 101 mmol/L (ref 98–110)
Creat: 0.82 mg/dL (ref 0.50–1.05)
Glucose, Bld: 88 mg/dL (ref 65–99)
Potassium: 4.5 mmol/L (ref 3.5–5.3)
Sodium: 138 mmol/L (ref 135–146)

## 2023-05-25 LAB — LIPID PANEL
Cholesterol: 190 mg/dL (ref <200)
HDL: 83 mg/dL (ref >=50)
LDL Cholesterol (Calc): 91 mg/dL (calc)
Non-HDL Cholesterol (Calc): 107 mg/dL (calc) (ref <130)
Total CHOL/HDL Ratio: 2.3 (calc) (ref <5.0)
Triglycerides: 72 mg/dL (ref <150)

## 2023-05-25 LAB — TSH: TSH: 4.03 mIU/L (ref 0.40–4.50)

## 2023-05-27 ENCOUNTER — Telehealth: Payer: Self-pay

## 2023-05-27 NOTE — Telephone Encounter (Signed)
-----   Message from Neena Rhymes sent at 05/27/2023  7:33 AM EDT ----- Labs look great!  No changes at this time

## 2023-05-27 NOTE — Telephone Encounter (Signed)
Pt seen results via my chart  

## 2023-07-10 ENCOUNTER — Other Ambulatory Visit: Payer: Self-pay | Admitting: Family Medicine

## 2023-07-10 DIAGNOSIS — Z1231 Encounter for screening mammogram for malignant neoplasm of breast: Secondary | ICD-10-CM

## 2023-07-15 ENCOUNTER — Other Ambulatory Visit: Payer: Self-pay

## 2023-07-15 ENCOUNTER — Other Ambulatory Visit (HOSPITAL_BASED_OUTPATIENT_CLINIC_OR_DEPARTMENT_OTHER): Payer: Self-pay | Admitting: Obstetrics & Gynecology

## 2023-07-15 ENCOUNTER — Other Ambulatory Visit (HOSPITAL_COMMUNITY): Payer: Self-pay

## 2023-07-15 ENCOUNTER — Other Ambulatory Visit: Payer: Self-pay | Admitting: Family Medicine

## 2023-07-15 ENCOUNTER — Other Ambulatory Visit (HOSPITAL_BASED_OUTPATIENT_CLINIC_OR_DEPARTMENT_OTHER): Payer: Self-pay

## 2023-07-15 DIAGNOSIS — N951 Menopausal and female climacteric states: Secondary | ICD-10-CM

## 2023-07-15 MED ORDER — GABAPENTIN 100 MG PO CAPS
ORAL_CAPSULE | ORAL | 0 refills | Status: AC
Start: 2023-07-15 — End: ?
  Filled 2023-07-15: qty 60, 30d supply, fill #0

## 2023-07-15 MED ORDER — ROSUVASTATIN CALCIUM 10 MG PO TABS
10.0000 mg | ORAL_TABLET | Freq: Every day | ORAL | 1 refills | Status: DC
  Filled 2023-07-15 (×2): qty 90, 90d supply, fill #0
  Filled 2023-10-25 (×2): qty 90, 90d supply, fill #1

## 2023-07-22 DIAGNOSIS — L821 Other seborrheic keratosis: Secondary | ICD-10-CM | POA: Diagnosis not present

## 2023-07-22 DIAGNOSIS — D2261 Melanocytic nevi of right upper limb, including shoulder: Secondary | ICD-10-CM | POA: Diagnosis not present

## 2023-07-22 DIAGNOSIS — D1801 Hemangioma of skin and subcutaneous tissue: Secondary | ICD-10-CM | POA: Diagnosis not present

## 2023-07-22 DIAGNOSIS — D485 Neoplasm of uncertain behavior of skin: Secondary | ICD-10-CM | POA: Diagnosis not present

## 2023-07-22 DIAGNOSIS — D225 Melanocytic nevi of trunk: Secondary | ICD-10-CM | POA: Diagnosis not present

## 2023-07-22 DIAGNOSIS — D224 Melanocytic nevi of scalp and neck: Secondary | ICD-10-CM | POA: Diagnosis not present

## 2023-07-22 DIAGNOSIS — D2262 Melanocytic nevi of left upper limb, including shoulder: Secondary | ICD-10-CM | POA: Diagnosis not present

## 2023-08-08 ENCOUNTER — Encounter (HOSPITAL_BASED_OUTPATIENT_CLINIC_OR_DEPARTMENT_OTHER): Payer: Self-pay | Admitting: Obstetrics & Gynecology

## 2023-08-08 ENCOUNTER — Other Ambulatory Visit (HOSPITAL_BASED_OUTPATIENT_CLINIC_OR_DEPARTMENT_OTHER): Payer: Self-pay

## 2023-08-08 ENCOUNTER — Other Ambulatory Visit (HOSPITAL_BASED_OUTPATIENT_CLINIC_OR_DEPARTMENT_OTHER): Payer: Self-pay | Admitting: *Deleted

## 2023-08-08 DIAGNOSIS — N951 Menopausal and female climacteric states: Secondary | ICD-10-CM

## 2023-08-08 MED ORDER — GABAPENTIN 100 MG PO CAPS
100.0000 mg | ORAL_CAPSULE | Freq: Every evening | ORAL | 1 refills | Status: DC | PRN
Start: 2023-08-08 — End: 2023-08-20
  Filled 2023-08-08: qty 30, 30d supply, fill #0

## 2023-08-14 DIAGNOSIS — H5213 Myopia, bilateral: Secondary | ICD-10-CM | POA: Diagnosis not present

## 2023-08-15 ENCOUNTER — Other Ambulatory Visit (HOSPITAL_BASED_OUTPATIENT_CLINIC_OR_DEPARTMENT_OTHER): Payer: Self-pay

## 2023-08-15 MED ORDER — FLULAVAL 0.5 ML IM SUSY
PREFILLED_SYRINGE | INTRAMUSCULAR | 0 refills | Status: DC
  Filled 2023-08-15: qty 0.5, 1d supply, fill #0

## 2023-08-20 ENCOUNTER — Other Ambulatory Visit (HOSPITAL_BASED_OUTPATIENT_CLINIC_OR_DEPARTMENT_OTHER): Payer: Self-pay | Admitting: *Deleted

## 2023-08-20 ENCOUNTER — Other Ambulatory Visit (HOSPITAL_BASED_OUTPATIENT_CLINIC_OR_DEPARTMENT_OTHER): Payer: Self-pay

## 2023-08-20 DIAGNOSIS — N951 Menopausal and female climacteric states: Secondary | ICD-10-CM

## 2023-08-20 MED ORDER — GABAPENTIN 100 MG PO CAPS
200.0000 mg | ORAL_CAPSULE | Freq: Every evening | ORAL | 1 refills | Status: DC | PRN
  Filled 2023-08-20 – 2023-08-22 (×4): qty 60, 30d supply, fill #0

## 2023-08-22 ENCOUNTER — Other Ambulatory Visit (HOSPITAL_BASED_OUTPATIENT_CLINIC_OR_DEPARTMENT_OTHER): Payer: Self-pay

## 2023-08-23 ENCOUNTER — Ambulatory Visit
Admission: RE | Admit: 2023-08-23 | Discharge: 2023-08-23 | Disposition: A | Discharge status: Home / Self Care | Payer: 59 | Source: Ambulatory Visit | Attending: Family Medicine | Admitting: Family Medicine

## 2023-08-23 DIAGNOSIS — Z1231 Encounter for screening mammogram for malignant neoplasm of breast: Secondary | ICD-10-CM | POA: Diagnosis not present

## 2023-09-05 ENCOUNTER — Encounter (HOSPITAL_BASED_OUTPATIENT_CLINIC_OR_DEPARTMENT_OTHER): Payer: Self-pay | Admitting: Obstetrics & Gynecology

## 2023-09-05 ENCOUNTER — Ambulatory Visit (HOSPITAL_BASED_OUTPATIENT_CLINIC_OR_DEPARTMENT_OTHER): Payer: 59 | Admitting: Obstetrics & Gynecology

## 2023-09-05 ENCOUNTER — Other Ambulatory Visit (HOSPITAL_BASED_OUTPATIENT_CLINIC_OR_DEPARTMENT_OTHER): Payer: Self-pay

## 2023-09-05 VITALS — BP 111/67 | HR 67 | Ht 64.0 in | Wt 107.0 lb

## 2023-09-05 DIAGNOSIS — Z01419 Encounter for gynecological examination (general) (routine) without abnormal findings: Secondary | ICD-10-CM | POA: Diagnosis not present

## 2023-09-05 DIAGNOSIS — N950 Postmenopausal bleeding: Secondary | ICD-10-CM

## 2023-09-05 DIAGNOSIS — N951 Menopausal and female climacteric states: Secondary | ICD-10-CM | POA: Diagnosis not present

## 2023-09-05 DIAGNOSIS — N952 Postmenopausal atrophic vaginitis: Secondary | ICD-10-CM

## 2023-09-05 DIAGNOSIS — M85852 Other specified disorders of bone density and structure, left thigh: Secondary | ICD-10-CM | POA: Diagnosis not present

## 2023-09-05 MED ORDER — ESTRADIOL 0.1 MG/GM VA CREA
TOPICAL_CREAM | VAGINAL | 3 refills | Status: AC
  Filled 2023-09-05: qty 42.5, 30d supply, fill #0
  Filled 2023-12-20 (×2): qty 42.5, 30d supply, fill #1
  Filled 2024-01-17: qty 42.5, 30d supply, fill #2

## 2023-09-05 MED ORDER — GABAPENTIN 100 MG PO CAPS
200.0000 mg | ORAL_CAPSULE | Freq: Every evening | ORAL | 3 refills | Status: DC | PRN
  Filled 2023-09-05 – 2023-09-26 (×2): qty 180, 90d supply, fill #0
  Filled 2023-12-30: qty 180, 90d supply, fill #1

## 2023-09-05 NOTE — Progress Notes (Signed)
62 y.o. G42P1011 Married White or Caucasian female here for annual exam.  Doing well.  Denies vaginal bleeding except occasional pink discharge after intercourse.  Using vaginal estrogen cream.    No LMP recorded. Patient has had an ablation.          Sexually active: Yes.    The current method of family planning is vasectomy.    Exercising: yes, jazzercise 2- 3 times weekly Smoker:  no  Health Maintenance: Pap:  08/16/2022 Negative History of abnormal Pap:  no MMG:  08/23/2023 Negative Colonoscopy:  08/28/2022, follow up 5 years BMD:   06/17/2019 Osteopenia, -1.5 Screening Labs: 05/2023   reports that she has never smoked. She has been exposed to tobacco smoke. She has never used smokeless tobacco. She reports current alcohol use. She reports that she does not use drugs.  Past Medical History:  Diagnosis Date   HLD (hyperlipidemia)    Osteopenia    STD (sexually transmitted disease)    HSV   Urinary incontinence     Past Surgical History:  Procedure Laterality Date   BREAST EXCISIONAL BIOPSY Right    CESAREAN SECTION     COLONOSCOPY     NOVASURE ABLATION  10/06/2010   in office, had vasovagal response   PLATLET RICH PLASMA Right     Current Outpatient Medications  Medication Sig Dispense Refill   Ascorbic Acid (VITAMIN C PO) Take by mouth daily.     Black Cohosh 40 MG CAPS Take by mouth daily.     CALCIUM PO Take by mouth daily. ONE DAILY     estradiol (ESTRACE) 0.1 MG/GM vaginal cream Insert 1 gram vaginally twice weekly 42.5 g 3   gabapentin (NEURONTIN) 100 MG capsule Take 2 capsules (200 mg total) by mouth at bedtime as needed for hot flashes 180 capsule 3   glucosamine-chondroitin 500-400 MG tablet Take 1 tablet by mouth daily.     Multiple Vitamins-Minerals (MULTIVITAMIN WOMEN 50+ PO) Take by mouth daily. ONE DAILY     rosuvastatin (CRESTOR) 10 MG tablet Take 1 tablet (10 mg total) by mouth daily. 90 tablet 1   TURMERIC PO Take by mouth daily.     No current  facility-administered medications for this visit.    Family History  Problem Relation Age of Onset   Heart disease Paternal Grandfather    Heart disease Paternal Grandmother    Breast cancer Maternal Grandmother        dx. 50 or younger   Heart Problems Maternal Grandfather    Hyperlipidemia Father    Heart attack Father    Heart disease Father    Transient ischemic attack Father    Prostate cancer Father        dx. 82-71   Melanoma Father        dx. >71   Diverticulitis Father    Hyperlipidemia Mother    Thyroid disease Mother    Osteoporosis Mother    Heart failure Mother    Colon cancer Sister        dx. 58-59   Breast cancer Sister 21       mastectomy   Brain cancer Maternal Uncle        unspecified type   Cirrhosis Maternal Uncle        +EtOH   Testicular cancer Maternal Uncle        dx. early 60s   Heart Problems Paternal Uncle    Lung cancer Cousin  d. late 38s; smoker   Testicular cancer Cousin 50   Melanoma Cousin 60   Sudden death Neg Hx    Hypertension Neg Hx    Diabetes Neg Hx    Crohn's disease Neg Hx    Esophageal cancer Neg Hx    Rectal cancer Neg Hx    Stomach cancer Neg Hx    Ulcerative colitis Neg Hx     ROS: Constitutional: negative Genitourinary:negative  Exam:   BP 111/67 (BP Location: Right Arm, Patient Position: Sitting, Cuff Size: Normal)   Pulse 67   Ht 5\' 4"  (1.626 m)   Wt 107 lb (48.5 kg)   BMI 18.37 kg/m   Height: 5\' 4"  (162.6 cm)  General appearance: alert, cooperative and appears stated age Head: Normocephalic, without obvious abnormality, atraumatic Neck: no adenopathy, supple, symmetrical, trachea midline and thyroid normal to inspection and palpation Lungs: clear to auscultation bilaterally Breasts: normal appearance, no masses or tenderness Heart: regular rate and rhythm Abdomen: soft, non-tender; bowel sounds normal; no masses,  no organomegaly Extremities: extremities normal, atraumatic, no cyanosis or  edema Skin: Skin color, texture, turgor normal. No rashes or lesions Lymph nodes: Cervical, supraclavicular, and axillary nodes normal. No abnormal inguinal nodes palpated Neurologic: Grossly normal   Pelvic: External genitalia:  no lesions              Urethra:  normal appearing urethra with no masses, tenderness or lesions              Bartholins and Skenes: normal                 Vagina: normal appearing vagina with normal color and no discharge, no lesions              Cervix: no lesions              Pap taken: No. Bimanual Exam:  Uterus:  normal size, contour, position, consistency, mobility, non-tender              Adnexa: normal adnexa and no mass, fullness, tenderness               Rectovaginal: Confirms               Anus:  normal sphincter tone, no lesions  Chaperone, Ina Homes, CMA, was present for exam.  Assessment/Plan: 1. Well woman exam with routine gynecological exam - Pap smear neg with neg HR HPV 2023 - Mammogram 08/2023 - Colonoscopy 08/2022 - Bone mineral density ordered - lab work done with PCP, Dr. Beverely Low - vaccines reviewed/updated  2. Osteopenia of neck of left femur - DG BONE DENSITY (DXA); Future  3. Postmenopausal bleeding - US PELVIS TRANSVAGINAL NON-OB (TV ONLY); Future  4. Hot flashes due to menopause - gabapentin (NEURONTIN) 100 MG capsule; Take 2 capsules (200 mg total) by mouth at bedtime as needed for hot flashes  Dispense: 180 capsule; Refill: 3  5. Vaginal atrophy - estradiol (ESTRACE) 0.1 MG/GM vaginal cream; Insert 1 gram vaginally twice weekly  Dispense: 42.5 g; Refill: 3

## 2023-09-08 ENCOUNTER — Other Ambulatory Visit (HOSPITAL_BASED_OUTPATIENT_CLINIC_OR_DEPARTMENT_OTHER): Payer: Self-pay

## 2023-09-10 ENCOUNTER — Telehealth (HOSPITAL_BASED_OUTPATIENT_CLINIC_OR_DEPARTMENT_OTHER): Payer: Self-pay | Admitting: *Deleted

## 2023-09-10 NOTE — Telephone Encounter (Signed)
Pt called to let us know that someone from imaging called to try to schedule an appt for what she thought was a mammogram. Pt reports that she had just had mammogram done. Informed pt that Dr. Hyacinth Meeker ordered a bone density test and they may have been calling to schedule that. Pt provided with phone number to imaging department.

## 2023-09-10 NOTE — Telephone Encounter (Signed)
Patient would like to speak to the nurse about order Dr. Hyacinth Meeker placed.

## 2023-09-11 ENCOUNTER — Ambulatory Visit (HOSPITAL_BASED_OUTPATIENT_CLINIC_OR_DEPARTMENT_OTHER): Payer: 59

## 2023-09-11 ENCOUNTER — Encounter (HOSPITAL_BASED_OUTPATIENT_CLINIC_OR_DEPARTMENT_OTHER): Payer: Self-pay | Admitting: Obstetrics & Gynecology

## 2023-09-11 ENCOUNTER — Ambulatory Visit (HOSPITAL_BASED_OUTPATIENT_CLINIC_OR_DEPARTMENT_OTHER): Payer: 59 | Admitting: Obstetrics & Gynecology

## 2023-09-11 VITALS — BP 110/68 | HR 75 | Ht 64.0 in | Wt 108.4 lb

## 2023-09-11 DIAGNOSIS — N950 Postmenopausal bleeding: Secondary | ICD-10-CM | POA: Diagnosis not present

## 2023-09-14 NOTE — Progress Notes (Signed)
GYNECOLOGY  VISIT  CC:   follow up after ultrasound  HPI: 62 y.o. G21P1011 Married White or Caucasian female here for discussion of ultrasound results done earlier today.  Has experienced some vaginal bleeding that she feels is related to vaginal atrophic changes.  I did recommend some additional evaluation and she was comfortable with this.    Uterus 5 x 3.2 x 2.1cm with endometrium 2.84mm.  Ovaries normal bilaterally.  Cervix normal as well.  No significant findings noted.    Images reviewed with pt and results discussed.   Past Medical History:  Diagnosis Date   HLD (hyperlipidemia)    Osteopenia    STD (sexually transmitted disease)    HSV   Urinary incontinence     MEDS:   Current Outpatient Medications on File Prior to Visit  Medication Sig Dispense Refill   Ascorbic Acid (VITAMIN C PO) Take by mouth daily.     Black Cohosh 40 MG CAPS Take by mouth daily.     CALCIUM PO Take by mouth daily. ONE DAILY     estradiol (ESTRACE) 0.1 MG/GM vaginal cream Insert 1 gram vaginally twice weekly 42.5 g 3   gabapentin (NEURONTIN) 100 MG capsule Take 2 capsules (200 mg total) by mouth at bedtime as needed for hot flashes 180 capsule 3   glucosamine-chondroitin 500-400 MG tablet Take 1 tablet by mouth daily.     Multiple Vitamins-Minerals (MULTIVITAMIN WOMEN 50+ PO) Take by mouth daily. ONE DAILY     rosuvastatin (CRESTOR) 10 MG tablet Take 1 tablet (10 mg total) by mouth daily. 90 tablet 1   TURMERIC PO Take by mouth daily.     No current facility-administered medications on file prior to visit.    ALLERGIES: Naproxen sodium and Penicillins  SH:  married, non smoker  Review of Systems  Constitutional: Negative.   Genitourinary:        Pinkish vaginal discharge    PHYSICAL EXAMINATION:    BP 110/68 (BP Location: Right Arm, Patient Position: Sitting, Cuff Size: Normal)   Pulse 75   Ht 5\' 4"  (1.626 m)   Wt 108 lb 6.4 oz (49.2 kg)   BMI 18.61 kg/m       Assessment/Plan: 1.  Postmenopausal bleeding - this is likely due to atrophic changes.  She was started on vaginal estrogen cream after being seen at the end of October.  There has not been enough time, yet, to see if this if going to improve symptoms.  She will let me know if continues even after estrogen use for a few months.

## 2023-09-19 ENCOUNTER — Other Ambulatory Visit (HOSPITAL_BASED_OUTPATIENT_CLINIC_OR_DEPARTMENT_OTHER): Payer: Self-pay

## 2023-09-26 ENCOUNTER — Other Ambulatory Visit: Payer: Self-pay

## 2023-10-17 ENCOUNTER — Ambulatory Visit (HOSPITAL_BASED_OUTPATIENT_CLINIC_OR_DEPARTMENT_OTHER): Admission: RE | Admit: 2023-10-17 | Payer: 59 | Source: Ambulatory Visit

## 2023-10-25 ENCOUNTER — Other Ambulatory Visit: Payer: Self-pay

## 2023-10-25 ENCOUNTER — Other Ambulatory Visit (HOSPITAL_BASED_OUTPATIENT_CLINIC_OR_DEPARTMENT_OTHER): Payer: Self-pay

## 2023-10-28 ENCOUNTER — Ambulatory Visit (INDEPENDENT_AMBULATORY_CARE_PROVIDER_SITE_OTHER): Payer: 59 | Admitting: Family Medicine

## 2023-10-28 VITALS — BP 94/64 | HR 89 | Temp 98.1°F | Ht 64.0 in | Wt 106.4 lb

## 2023-10-28 DIAGNOSIS — R197 Diarrhea, unspecified: Secondary | ICD-10-CM | POA: Diagnosis not present

## 2023-10-28 LAB — CBC WITH DIFFERENTIAL/PLATELET
Basophils Absolute: 0 10*3/uL (ref 0.0–0.1)
Basophils Relative: 0.8 % (ref 0.0–3.0)
Eosinophils Absolute: 0.1 10*3/uL (ref 0.0–0.7)
Eosinophils Relative: 2.8 % (ref 0.0–5.0)
HCT: 43.3 % (ref 36.0–46.0)
Hemoglobin: 14.7 g/dL (ref 12.0–15.0)
Lymphocytes Relative: 48.6 % — ABNORMAL HIGH (ref 12.0–46.0)
Lymphs Abs: 2.4 10*3/uL (ref 0.7–4.0)
MCHC: 33.9 g/dL (ref 30.0–36.0)
MCV: 89.4 fL (ref 78.0–100.0)
Monocytes Absolute: 0.3 10*3/uL (ref 0.1–1.0)
Monocytes Relative: 6.9 % (ref 3.0–12.0)
Neutro Abs: 2 10*3/uL (ref 1.4–7.7)
Neutrophils Relative %: 40.9 % — ABNORMAL LOW (ref 43.0–77.0)
Platelets: 241 10*3/uL (ref 150.0–400.0)
RBC: 4.84 Mil/uL (ref 3.87–5.11)
RDW: 13.3 % (ref 11.5–15.5)
WBC: 4.9 10*3/uL (ref 4.0–10.5)

## 2023-10-28 LAB — BASIC METABOLIC PANEL
BUN: 13 mg/dL (ref 6–23)
CO2: 32 meq/L (ref 19–32)
Calcium: 10 mg/dL (ref 8.4–10.5)
Chloride: 104 meq/L (ref 96–112)
Creatinine, Ser: 0.78 mg/dL (ref 0.40–1.20)
GFR: 82.36 mL/min (ref >60.00)
Glucose, Bld: 89 mg/dL (ref 70–99)
Potassium: 4.9 meq/L (ref 3.5–5.1)
Sodium: 142 meq/L (ref 135–145)

## 2023-10-28 LAB — HEPATIC FUNCTION PANEL
ALT: 18 U/L (ref 0–35)
AST: 20 U/L (ref 0–37)
Albumin: 4.7 g/dL (ref 3.5–5.2)
Alkaline Phosphatase: 46 U/L (ref 39–117)
Bilirubin, Direct: 0.1 mg/dL (ref 0.0–0.3)
Total Bilirubin: 0.7 mg/dL (ref 0.2–1.2)
Total Protein: 7 g/dL (ref 6.0–8.3)

## 2023-10-28 NOTE — Progress Notes (Signed)
   Subjective:    Patient ID: Amanda Evans, female    DOB: 1961/10/21, 62 y.o.   MRN: [id number]  HPI Watery stools- sxs started 8 days ago.  Pt reports stools were like water- just pouring out.  Started [employer]  Continued to have diarrhea.  Thursday and Fri didn't have BM.  Had normal poop on Saturday.  Again had watery stools yesterday- 1 and done.  Took 2 Imodium last night.  No fever.  Has been drinking electrolyte fluids.  No blood in stool.  At onset of sxs stool was foul smelling.  No known sick contacts.  No abd pain or cramping.   Review of Systems For ROS see HPI     Objective:   Physical Exam Vitals reviewed.  Constitutional:      General: She is not in acute distress.    Appearance: Normal appearance. She is well-developed. She is not ill-appearing.  HENT:     Head: Normocephalic and atraumatic.  Cardiovascular:     Rate and Rhythm: Normal rate and regular rhythm.  Pulmonary:     Effort: Pulmonary effort is normal. No respiratory distress.     Breath sounds: Normal breath sounds. No wheezing or rales.  Abdominal:     General: Bowel sounds are normal. There is no distension.     Palpations: Abdomen is soft.     Tenderness: There is no abdominal tenderness. There is no rebound.  Musculoskeletal:     Cervical back: Neck supple.  Lymphadenopathy:     Cervical: No cervical adenopathy.  Skin:    General: Skin is warm and dry.  Neurological:     Mental Status: She is alert and oriented to person, place, and time.           Assessment & Plan:  Watery diarrhea- new.  Pt's sxs started 8 days ago after eating sushi but no one else got sick.  Reports that stool was 'pouring out' of her 'like water' and was foul smelling- suggestive of [place].  Denies abd pain/cramping.  No fevers.  No blood in stool.  Stools improved and were actually normal and then she ate [place] food Saturday night and ate at a diner yesterday for breakfast.  Had another episode of watery stool but  just once.  Took Imodium w/ good relief.  Will check electrolytes, LFT's, and CBC to r/o infxn.  Suspect viral illness that was improving but she took a step backwards w/ [place] food and greasy diner food.  Reviewed supportive care and red flags that should prompt return.  Pt expressed understanding and is in agreement w/ plan.

## 2023-10-28 NOTE — Patient Instructions (Signed)
Follow up as needed or as scheduled We'll notify you of your lab results and make any changes if needed Drink LOTS of fluids Try a bland diet, but you can advance as tolerated- apples, veggies (no butter), chicken Avoid butter, grease, and dairy as much as possible as these can contribute to diarrhea Take Imodium as needed to slow stools Call with any questions or concerns Hang in there! Happy Holidays!

## 2023-10-29 ENCOUNTER — Telehealth: Payer: Self-pay

## 2023-10-29 NOTE — Telephone Encounter (Signed)
-----   Message from Neena Rhymes sent at 10/29/2023  7:31 AM EST ----- Labs look great!  You mildly decreased neutrophils and mildly increased lymphocytes are consistent w/ a viral illness.  This should continue to improve w/ time.

## 2023-10-29 NOTE — Telephone Encounter (Signed)
Pt has reviewed via MyChart

## 2023-12-20 ENCOUNTER — Other Ambulatory Visit (HOSPITAL_BASED_OUTPATIENT_CLINIC_OR_DEPARTMENT_OTHER): Payer: Self-pay

## 2023-12-23 ENCOUNTER — Other Ambulatory Visit: Payer: Self-pay

## 2023-12-24 ENCOUNTER — Other Ambulatory Visit: Payer: Self-pay

## 2023-12-24 ENCOUNTER — Other Ambulatory Visit (HOSPITAL_BASED_OUTPATIENT_CLINIC_OR_DEPARTMENT_OTHER): Payer: Self-pay

## 2024-01-09 ENCOUNTER — Ambulatory Visit (INDEPENDENT_AMBULATORY_CARE_PROVIDER_SITE_OTHER): Payer: Self-pay | Admitting: Family Medicine

## 2024-01-09 ENCOUNTER — Encounter: Payer: Self-pay | Admitting: Family Medicine

## 2024-01-09 ENCOUNTER — Other Ambulatory Visit (HOSPITAL_BASED_OUTPATIENT_CLINIC_OR_DEPARTMENT_OTHER): Payer: Self-pay

## 2024-01-09 VITALS — BP 102/72 | HR 80 | Temp 98.5°F | Wt 103.0 lb

## 2024-01-09 DIAGNOSIS — B369 Superficial mycosis, unspecified: Secondary | ICD-10-CM

## 2024-01-09 MED ORDER — CLOTRIMAZOLE-BETAMETHASONE 1-0.05 % EX CREA
1.0000 | TOPICAL_CREAM | Freq: Two times a day (BID) | CUTANEOUS | 0 refills | Status: DC
  Filled 2024-01-09: qty 45, 23d supply, fill #0

## 2024-01-09 NOTE — Progress Notes (Signed)
   Subjective:    Patient ID: Georga Kaufmann, female    DOB: [birth year], 63 y.o.   MRN: [id number]  HPI Rash- 'i've got eczema'.  Pt has hx of childhood eczema but has not had issues recently.  2 days ago developed.  Has large patch under L breast that is both burning and itchy- then painful when scratched.   Review of Systems For ROS see HPI     Objective:   Physical Exam Vitals reviewed.  Constitutional:      General: She is not in acute distress.    Appearance: Normal appearance. She is not ill-appearing.  HENT:     Head: Normocephalic and atraumatic.  Skin:    General: Skin is warm and dry.     Findings: Lesion (well demarcated erythematous lesion under L breast w/ scaly edges) present.  Neurological:     General: No focal deficit present.     Mental Status: She is alert and oriented to person, place, and time.  Psychiatric:        Mood and Affect: Mood normal.        Behavior: Behavior normal.        Thought Content: Thought content normal.           Assessment & Plan:  Fungal dermatitis- new.  Reviewed dx w/ pt.  Start topical Lotrisone cream.  Reviewed supportive care and red flags that should prompt return.  Pt expressed understanding and is in agreement w/ plan.

## 2024-01-09 NOTE — Patient Instructions (Signed)
 Follow up as needed or as scheduled START the Lotrisone combination cream twice daily on the red, itchy areas Try and change after exercise ASAP Call with any questions or concerns Stay Safe!  Stay Healthy! ENJOY THE PUPPY!!

## 2024-01-17 ENCOUNTER — Other Ambulatory Visit: Payer: Self-pay

## 2024-01-17 ENCOUNTER — Other Ambulatory Visit (HOSPITAL_COMMUNITY): Payer: Self-pay

## 2024-01-17 ENCOUNTER — Other Ambulatory Visit: Payer: Self-pay | Admitting: Family Medicine

## 2024-01-17 MED ORDER — ROSUVASTATIN CALCIUM 10 MG PO TABS
10.0000 mg | ORAL_TABLET | Freq: Every day | ORAL | 1 refills | Status: DC
  Filled 2024-01-17: qty 90, 90d supply, fill #0
  Filled 2024-04-28: qty 90, 90d supply, fill #1

## 2024-01-20 ENCOUNTER — Other Ambulatory Visit (HOSPITAL_COMMUNITY): Payer: Self-pay

## 2024-01-20 ENCOUNTER — Other Ambulatory Visit: Payer: Self-pay

## 2024-04-13 ENCOUNTER — Telehealth: Payer: Self-pay

## 2024-04-13 ENCOUNTER — Encounter: Payer: Self-pay | Admitting: Student in an Organized Health Care Education/Training Program

## 2024-04-13 ENCOUNTER — Ambulatory Visit (INDEPENDENT_AMBULATORY_CARE_PROVIDER_SITE_OTHER): Payer: Self-pay | Admitting: Student in an Organized Health Care Education/Training Program

## 2024-04-13 VITALS — BP 110/59 | HR 71 | Wt 105.0 lb

## 2024-04-13 DIAGNOSIS — F410 Panic disorder [episodic paroxysmal anxiety] without agoraphobia: Secondary | ICD-10-CM | POA: Diagnosis not present

## 2024-04-13 DIAGNOSIS — R002 Palpitations: Secondary | ICD-10-CM | POA: Diagnosis not present

## 2024-04-13 NOTE — Telephone Encounter (Signed)
 Copied from CRM [id number]. Topic: Clinical - Medical Advice >> Apr 13, 2024  8:00 AM Deaijah H wrote: Reason for CRM: Patient called in stating she felt like she had an heart episode that made her cough and made her feel like she was about pass out along with vision going gray and a warm sensation in her head. Would like medical advice from a nurse please call [phone]

## 2024-04-13 NOTE — Assessment & Plan Note (Signed)
 Recent episode of palpitation.  EKG is reassuring.  I think that this was due to a panic attack.

## 2024-04-13 NOTE — Progress Notes (Signed)
   Acute Office Visit  Subjective:     Patient ID: Amanda Evans, female    DOB: 1961/10/30, 63 y.o.   MRN: [id number]  Chief Complaint  Patient presents with   Chest Pain    Patient states she had an episode of heart palpitations, light headed,cough and grey vision. Patient coughed a couple times and then a warm sensation into head she states. Patient states no symptoms since yesterday.     HPI  63 year old person who is otherwise healthy here for an acute visit because of a sudden onset sensation of feeling unwell that occurred yesterday while she was driving.  She reported the sensation feeling like her heart was beating out of her chest, not beating rapidly but just very strongly, it was associated with a sensation of fear/dread, lightheadedness, tachypnea, and then a large flushing sensation.  The symptoms resolved on their own after a few minutes and she was able to drive herself home.  She is never felt anything like this before.  She has had no chest pain or shortness of breath.  No other changes in her health recently.  No changes in her medications.  No recent illnesses.  She has been under a lot of stress recently.  Her husband had rotator cuff surgery and she has been helping to care for him since that over the last 2 weeks.  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  She just got a new dog and has been trying to care to and train it.       Objective:    BP (!) 110/59   Pulse 71   Wt 105 lb (47.6 kg)   SpO2 100%   BMI 18.02 kg/m    Physical Exam  Gen: Well-appearing woman Neck: Normal thyroid , no nodules Heart: Regular, no murmur Lungs: Unlabored, clear throughout Ext: Warm, no edema  EKG: Obtained due to sensation of palpitations, EKG is normal sinus rhythm, normal axis, normal intervals, QTc 410, no Q waves, no ST changes or T wave inversions.     Assessment & Plan:   Problem List Items Addressed This Visit        Unprioritized   Palpitation - Primary   Recent episode of palpitation.  EKG is reassuring.  I think that this was due to a panic attack.        Relevant Orders   EKG 12-Lead   Panic attack   Symptoms seem most consistent with a panic attack.  This is the first episode.  Not much burden of other anxiety symptoms, low suspicion for a generalized anxiety disorder.  No other symptoms to suggest hyperthyroidism.  EKG is reassuring.  Otherwise very functional and seems to be in great health.  Patient going through a lot of stress right now.  I offered the patient medication assisted treatment with propranolol, but she declines.  She is going to work on modifying the amount of stress in her life.  I recommended that she come see us  again if she is having persistent symptoms.       Ether Hercules, MD

## 2024-04-13 NOTE — Telephone Encounter (Signed)
 Pt reports her heart felt like she "jumping" and vision was gray. Pt reports she had to cough, after coughing she states her heart went back into rhythm .  Pt reports she has no sx today.

## 2024-04-13 NOTE — Assessment & Plan Note (Addendum)
 Symptoms seem most consistent with a panic attack.  This is the first episode.  Not much burden of other anxiety symptoms, low suspicion for a generalized anxiety disorder.  No other symptoms to suggest hyperthyroidism.  EKG is reassuring.  Otherwise very functional and seems to be in great health.  Patient going through a lot of stress right now.  I offered the patient medication assisted treatment with propranolol, but she declines.  She is going to work on modifying the amount of stress in her life.  I recommended that she come see us  again if she is having persistent symptoms.

## 2024-05-28 ENCOUNTER — Encounter: Payer: 59 | Admitting: Family Medicine

## 2024-07-03 ENCOUNTER — Ambulatory Visit (INDEPENDENT_AMBULATORY_CARE_PROVIDER_SITE_OTHER): Admitting: Family Medicine

## 2024-07-03 ENCOUNTER — Encounter: Payer: Self-pay | Admitting: Family Medicine

## 2024-07-03 VITALS — BP 96/62 | HR 64 | Temp 98.2°F | Resp 12 | Ht 64.0 in | Wt 108.4 lb

## 2024-07-03 DIAGNOSIS — E785 Hyperlipidemia, unspecified: Secondary | ICD-10-CM

## 2024-07-03 DIAGNOSIS — Z0000 Encounter for general adult medical examination without abnormal findings: Secondary | ICD-10-CM

## 2024-07-03 DIAGNOSIS — S638X1A Sprain of other part of right wrist and hand, initial encounter: Secondary | ICD-10-CM

## 2024-07-03 NOTE — Patient Instructions (Signed)
 Follow up in 1 year or as needed We'll notify you of your lab results and make any changes if needed Continue to work on healthy diet and regular exercise- you look great! Call with any questions or concerns Stay Safe!  Stay Healthy! ENJOY THE BEACH!!!

## 2024-07-03 NOTE — Progress Notes (Signed)
   Subjective:    Patient ID: Amanda Evans, female    DOB: 05/22/1961, 63 y.o.   MRN: [id number]  HPI CPE- UTD on mammo, pap, colonoscopy, Tdap.  Pt reports feeling good.  Patient Care Team    Relationship Specialty Notifications Start End  Amanda Comer BRAVO, MD PCP - General Family Medicine  11/20/21   Amanda Debby JINNY, MD Consulting Physician Sports Medicine  05/27/17   Amanda Ronal RAMAN, MD Consulting Physician Gynecology  11/02/20   Amanda Vina GAILS, MD Consulting Physician Cardiology  11/20/21   Amanda Gustav GAILS, MD Consulting Physician Gastroenterology  11/20/21      Health Maintenance  Topic Date Due   Pneumococcal Vaccine: 50+ Years (1 of 1 - PCV) Never done   INFLUENZA VACCINE  06/05/2024   COVID-19 Vaccine (3 - Pfizer risk series) 07/19/2024 (Originally 01/08/2020)   MAMMOGRAM  08/22/2024   Cervical Cancer Screening (HPV/Pap Cotest)  08/17/2027   Colonoscopy  08/29/2027   DTaP/Tdap/Td (3 - Td or Tdap) 05/23/2033   Hepatitis C Screening  Completed   HIV Screening  Completed   Zoster Vaccines- Shingrix   Completed   Hepatitis B Vaccines 19-59 Average Risk  Aged Out   HPV VACCINES  Aged Out   Meningococcal B Vaccine  Aged Out      Review of Systems Patient reports no vision/ hearing changes, adenopathy,fever, weight change,  persistant/recurrent hoarseness , swallowing issues, chest pain, palpitations, edema, persistant/recurrent cough, hemoptysis, dyspnea (rest/exertional/paroxysmal nocturnal), gastrointestinal bleeding (melena, rectal bleeding), abdominal pain, significant heartburn, bowel changes, GU symptoms (dysuria, hematuria, incontinence), Gyn symptoms (abnormal  bleeding, pain),  syncope, focal weakness, memory loss, numbness & tingling, skin/nail changes, abnormal bruising or bleeding, anxiety, or depression.   + R hand pain- 1st CMC joint.  At times feels like something is 'popping', painful to lift or grab + receding hairline    Objective:   Physical  Exam General Appearance:    Alert, cooperative, no distress, appears stated age  Head:    Normocephalic, without obvious abnormality, atraumatic  Eyes:    PERRL, conjunctiva/corneas clear, EOM's intact both eyes  Ears:    Normal TM's and external ear canals, both ears  Nose:   Nares normal, septum midline, mucosa normal, no drainage    or sinus tenderness  Throat:   Lips, mucosa, and tongue normal; teeth and gums normal  Neck:   Supple, symmetrical, trachea midline, no adenopathy;    Thyroid : no enlargement/tenderness/nodules  Back:     Symmetric, no curvature, ROM normal, no CVA tenderness  Lungs:     Clear to auscultation bilaterally, respirations unlabored  Chest Wall:    No tenderness or deformity   Heart:    Regular rate and rhythm, S1 and S2 normal, no murmur, rub   or gallop  Breast Exam:    Deferred to GYN  Abdomen:     Soft, non-tender, bowel sounds active all four quadrants,    no masses, no organomegaly  Genitalia:    Deferred to GYN  Rectal:    Extremities:   Extremities normal, atraumatic, no cyanosis or edema  Pulses:   2+ and symmetric all extremities  Skin:   Skin color, texture, turgor normal, no rashes or lesions  Lymph nodes:   Cervical, supraclavicular, and axillary nodes normal  Neurologic:   CNII-XII intact, normal strength, sensation and reflexes    throughout          Assessment & Plan:

## 2024-07-03 NOTE — Assessment & Plan Note (Signed)
Pt's PE WNL.  UTD on mammo, pap, colonoscopy, Tdap.  Check labs.  Anticipatory guidance provided.  

## 2024-07-04 LAB — HEPATIC FUNCTION PANEL
AG Ratio: 2.2 (calc) (ref 1.0–2.5)
ALT: 25 U/L (ref 6–29)
AST: 25 U/L (ref 10–35)
Albumin: 4.7 g/dL (ref 3.6–5.1)
Alkaline phosphatase (APISO): 46 U/L (ref 37–153)
Bilirubin, Direct: 0.1 mg/dL (ref 0.0–0.2)
Globulin: 2.1 g/dL (ref 1.9–3.7)
Indirect Bilirubin: 0.7 mg/dL (ref 0.2–1.2)
Total Bilirubin: 0.8 mg/dL (ref 0.2–1.2)
Total Protein: 6.8 g/dL (ref 6.1–8.1)

## 2024-07-04 LAB — CBC WITH DIFFERENTIAL/PLATELET
Absolute Lymphocytes: 2534 {cells}/uL (ref 850–3900)
Absolute Monocytes: 343 {cells}/uL (ref 200–950)
Basophils Absolute: 53 {cells}/uL (ref 0–200)
Basophils Relative: 0.8 %
Eosinophils Absolute: 40 {cells}/uL (ref 15–500)
Eosinophils Relative: 0.6 %
HCT: 42.9 % (ref 35.0–45.0)
Hemoglobin: 14.4 g/dL (ref 11.7–15.5)
MCH: 30.8 pg (ref 27.0–33.0)
MCHC: 33.6 g/dL (ref 32.0–36.0)
MCV: 91.9 fL (ref 80.0–100.0)
MPV: 9.4 fL (ref 7.5–12.5)
Monocytes Relative: 5.2 %
Neutro Abs: 3630 {cells}/uL (ref 1500–7800)
Neutrophils Relative %: 55 %
Platelets: 229 Thousand/uL (ref 140–400)
RBC: 4.67 Million/uL (ref 3.80–5.10)
RDW: 13 % (ref 11.0–15.0)
Total Lymphocyte: 38.4 %
WBC: 6.6 Thousand/uL (ref 3.8–10.8)

## 2024-07-04 LAB — LIPID PANEL
Cholesterol: 189 mg/dL (ref <200)
HDL: 80 mg/dL (ref >=50)
LDL Cholesterol (Calc): 96 mg/dL
Non-HDL Cholesterol (Calc): 109 mg/dL (ref <130)
Total CHOL/HDL Ratio: 2.4 (calc) (ref <5.0)
Triglycerides: 52 mg/dL (ref <150)

## 2024-07-04 LAB — BASIC METABOLIC PANEL WITH GFR
BUN: 15 mg/dL (ref 7–25)
CO2: 28 mmol/L (ref 20–32)
Calcium: 9.6 mg/dL (ref 8.6–10.4)
Chloride: 101 mmol/L (ref 98–110)
Creat: 0.68 mg/dL (ref 0.50–1.05)
Glucose, Bld: 82 mg/dL (ref 65–99)
Potassium: 4.1 mmol/L (ref 3.5–5.3)
Sodium: 138 mmol/L (ref 135–146)
eGFR: 99 mL/min/1.73m2 (ref >=60)

## 2024-07-04 LAB — TSH: TSH: 3.36 m[IU]/L (ref 0.40–4.50)

## 2024-07-07 ENCOUNTER — Ambulatory Visit: Payer: Self-pay | Admitting: Family Medicine

## 2024-07-07 ENCOUNTER — Telehealth: Payer: Self-pay

## 2024-07-07 ENCOUNTER — Encounter: Payer: Self-pay | Admitting: Sports Medicine

## 2024-07-07 ENCOUNTER — Telehealth (HOSPITAL_BASED_OUTPATIENT_CLINIC_OR_DEPARTMENT_OTHER): Payer: Self-pay

## 2024-07-07 MED ORDER — NITROFURANTOIN MONOHYD MACRO 100 MG PO CAPS: ORAL_CAPSULE | ORAL | 0 refills | Status: AC

## 2024-07-07 NOTE — Progress Notes (Signed)
 Pot has reviewed lab results via MyChart

## 2024-07-07 NOTE — Telephone Encounter (Signed)
 Patient left a message on nurse line requesting rx for UTI be sent to pharmacy in [facility]. Spoke with patient. Patient states she has a history of UTIs after intercourse and has been prescribed Macrobid  in the past to take following intercourse to prevent UTIs. Left her rx at home. Would like to have this on hand in case she needs it. Per review of chart patient has been prescribed Macrobid  100 mg take 1 tablet by mouth as needed (after sexual intercourse; increase to twice a day x3 days with symptoms). Rx sent to [facility].

## 2024-07-07 NOTE — Telephone Encounter (Unsigned)
 Copied from CRM [id number]. Topic: Clinical - Medical Advice >> Jul 07, 2024  8:51 AM Henretta I wrote: Reason for CRM: Patient went to [facility] and forgot her antibiotic for uti and wants to know if Dr. Mahlon can send macrobid  or nitrofuratin [facility] [address], [place] [address]. Patient states she only needs a 10 day supply just until she can get back home. [facility]: [phone]. Please give patient a call or text as she is having issues with mychart.

## 2024-07-07 NOTE — Telephone Encounter (Signed)
 Dr.Tabori did not treat this pt for UTI. Called pt to make her aware she would need to contact prescribing provider .

## 2024-07-21 ENCOUNTER — Other Ambulatory Visit (HOSPITAL_COMMUNITY): Payer: Self-pay

## 2024-07-21 ENCOUNTER — Other Ambulatory Visit: Payer: Self-pay | Admitting: Family Medicine

## 2024-07-21 ENCOUNTER — Other Ambulatory Visit: Payer: Self-pay

## 2024-07-21 MED ORDER — ROSUVASTATIN CALCIUM 10 MG PO TABS
10.0000 mg | ORAL_TABLET | Freq: Every day | ORAL | 1 refills | Status: AC
  Filled 2024-07-21: qty 90, 90d supply, fill #0
  Filled 2024-10-26 (×2): qty 90, 90d supply, fill #1

## 2024-07-28 ENCOUNTER — Encounter: Payer: Self-pay | Admitting: Family Medicine

## 2024-07-30 ENCOUNTER — Encounter: Admitting: Family Medicine

## 2024-07-31 DIAGNOSIS — M1811 Unilateral primary osteoarthritis of first carpometacarpal joint, right hand: Secondary | ICD-10-CM | POA: Diagnosis not present

## 2024-08-10 ENCOUNTER — Other Ambulatory Visit: Payer: Self-pay | Admitting: Family Medicine

## 2024-08-10 DIAGNOSIS — Z1231 Encounter for screening mammogram for malignant neoplasm of breast: Secondary | ICD-10-CM

## 2024-08-17 DIAGNOSIS — H5213 Myopia, bilateral: Secondary | ICD-10-CM | POA: Diagnosis not present

## 2024-08-24 ENCOUNTER — Ambulatory Visit
Admission: RE | Admit: 2024-08-24 | Discharge: 2024-08-24 | Disposition: A | Discharge status: Home / Self Care | Source: Ambulatory Visit | Attending: Family Medicine | Admitting: Family Medicine

## 2024-08-24 DIAGNOSIS — Z1231 Encounter for screening mammogram for malignant neoplasm of breast: Secondary | ICD-10-CM | POA: Diagnosis not present

## 2024-09-07 ENCOUNTER — Encounter: Payer: Self-pay | Admitting: Family Medicine

## 2024-09-08 ENCOUNTER — Ambulatory Visit: Admitting: Family

## 2024-09-08 ENCOUNTER — Other Ambulatory Visit (HOSPITAL_BASED_OUTPATIENT_CLINIC_OR_DEPARTMENT_OTHER): Payer: Self-pay

## 2024-09-08 VITALS — BP 120/84 | HR 74 | Temp 97.8°F | Wt 111.4 lb

## 2024-09-08 DIAGNOSIS — J029 Acute pharyngitis, unspecified: Secondary | ICD-10-CM

## 2024-09-08 DIAGNOSIS — N898 Other specified noninflammatory disorders of vagina: Secondary | ICD-10-CM

## 2024-09-08 LAB — POCT RAPID STREP A (OFFICE): Rapid Strep A Screen: NEGATIVE

## 2024-09-08 MED ORDER — VALACYCLOVIR HCL 1 G PO TABS
1000.0000 mg | ORAL_TABLET | Freq: Three times a day (TID) | ORAL | 1 refills | Status: AC
  Filled 2024-09-08: qty 21, 7d supply, fill #0

## 2024-09-08 NOTE — Progress Notes (Signed)
 Acute Office Visit  Subjective:     Patient ID: Amanda Evans, female    DOB: 08/02/1961, 63 y.o.   MRN: [id number]  Chief Complaint  Patient presents with  . Sore Throat    Started Saturday night but it has gotten better.   . Vaginal Pain    Has a lesion that came up Saturday sometime during the day, she noticed blood in her underwear and it has started to swell since then. It does sting and is itchy. She has used clotrimazole -betamethasone  cream on it and has not seen any improvement. She does have herpes and knows that stress can cause an outbreak and she did have a stressful weekend but hasn't had an outbreak in years.     HPI Patient is in today with complaints of a sore throat that started 3 nights ago.  Patient reports that she has been drinking tea, honey, and garlic that has helped soothe her throat and is actually much better today.  Did have some postnasal drip that is also resolved.  Patient also has concerns of a vaginal lesion that is red, swollen, and tender to touch.  She describes it as itchy and stinging.  She has been applying Lotrisone  cream that has not helped.  Reports having a history of genital herpes.  Also reports having a stressful weekend with having family in town and trying to entertain.  She has not had an outbreak in several years.  Review of Systems  Constitutional: Negative.   HENT:  Positive for sore throat.   Respiratory: Negative.    Cardiovascular: Negative.   Genitourinary:        Sore lesion on her vagina that is producing swelling, redness and burning  Musculoskeletal: Negative.   Neurological: Negative.   Endo/Heme/Allergies: Negative.   Psychiatric/Behavioral: Negative.    All other systems reviewed and are negative.  Past Medical History:  Diagnosis Date  . Anxiety 07/2022   Lost both parents this year + my dog of 11 years  . HLD (hyperlipidemia)   . Osteopenia   . STD (sexually transmitted disease)    HSV  . Urinary incontinence      Social History   Socioeconomic History  . Marital status: Married    Spouse name: Not on file  . Number of children: 1  . Years of education: Not on file  . Highest education level: Bachelor's degree (e.g., BA, AB, BS)  Occupational History  . Occupation: GRAPHIC DESIGNER    Employer: [employer]  Tobacco Use  . Smoking status: Never    Passive exposure: Past (FATHER SMOKED AS A CHILD)  . Smokeless tobacco: Never  Vaping Use  . Vaping status: Never Used  Substance and Sexual Activity  . Alcohol use: Yes    Alcohol/week: 1.0 standard drink of alcohol    Types: 1 Cans of beer per week    Comment: OCCASSIONALLY  . Drug use: No  . Sexual activity: Yes    Birth control/protection: None    Comment: vasectomy  Other Topics Concern  . Not on file  Social History Narrative  . Not on file   Social Drivers of Health   Financial Resource Strain: Low Risk  (09/07/2024)   Overall Financial Resource Strain (CARDIA)   . Difficulty of Paying Living Expenses: Not hard at all  Food Insecurity: No Food Insecurity (09/07/2024)   Hunger Vital Sign   . Worried About Programme Researcher, Broadcasting/film/video in the Last Year: Never true   .  Ran Out of Food in the Last Year: Never true  Transportation Needs: No Transportation Needs (09/07/2024)   PRAPARE - Transportation   . Lack of Transportation (Medical): No   . Lack of Transportation (Non-Medical): No  Physical Activity: Sufficiently Active (09/07/2024)   Exercise Vital Sign   . Days of Exercise per Week: 3 days   . Minutes of Exercise per Session: 50 min  Stress: No Stress Concern Present (09/07/2024)   [employer] of Occupational Health - Occupational Stress Questionnaire   . Feeling of Stress: Only a little  Social Connections: Socially Integrated (09/07/2024)   Social Connection and Isolation Panel   . Frequency of Communication with Friends and Family: Once a week   . Frequency of Social Gatherings with Friends and Family: Twice a week   .  Attends Religious Services: More than 4 times per year   . Active Member of Clubs or Organizations: Yes   . Attends Banker Meetings: More than 4 times per year   . Marital Status: Married  Catering Manager Violence: Not on file    Past Surgical History:  Procedure Laterality Date  . BREAST EXCISIONAL BIOPSY Right   . BREAST SURGERY  1987   removed benign cyst  . CESAREAN SECTION    . COLONOSCOPY    . NOVASURE ABLATION  10/06/2010   in office, had vasovagal response  . PLATLET RICH PLASMA Right     Family History  Problem Relation Age of Onset  . Heart disease Paternal Grandfather   . Heart disease Paternal Grandmother   . Breast cancer Maternal Grandmother        dx. 50 or younger  . Heart Problems Maternal Grandfather   . Heart disease Maternal Grandfather   . Hyperlipidemia Father   . Heart attack Father   . Heart disease Father   . Transient ischemic attack Father   . Prostate cancer Father        dx. 50-71  . Melanoma Father        dx. >30  . Diverticulitis Father   . Hyperlipidemia Mother   . Thyroid  disease Mother   . Osteoporosis Mother   . Heart failure Mother   . Arthritis Mother   . Colon cancer Sister        dx. 58-59  . Cancer Sister   . Breast cancer Sister 26       mastectomy  . Brain cancer Maternal Uncle        unspecified type  . Cirrhosis Maternal Uncle        +EtOH  . Testicular cancer Maternal Uncle        dx. early 70s  . Heart Problems Paternal Uncle   . Lung cancer Cousin        d. late 70s; smoker  . Testicular cancer Cousin 50  . Melanoma Cousin 60  . Sudden death Neg Hx   . Hypertension Neg Hx   . Diabetes Neg Hx   . Crohn's disease Neg Hx   . Esophageal cancer Neg Hx   . Rectal cancer Neg Hx   . Stomach cancer Neg Hx   . Ulcerative colitis Neg Hx     Allergies  Allergen Reactions  . Penicillins Swelling    REACTION: swelling  . Naproxen Sodium     REACTION: hives    Current Outpatient Medications on  File Prior to Visit  Medication Sig Dispense Refill  . Ascorbic Acid (VITAMIN C PO) Take by  mouth daily.    . Black Cohosh 40 MG CAPS Take by mouth daily.    . CALCIUM  PO Take by mouth daily. ONE DAILY    . estradiol  (ESTRACE ) 0.1 MG/GM vaginal cream Insert 1 gram vaginally twice weekly 42.5 g 3  . glucosamine-chondroitin 500-400 MG tablet Take 1 tablet by mouth daily.    . Multiple Vitamins-Minerals (MULTIVITAMIN WOMEN 50+ PO) Take by mouth daily. ONE DAILY    . nitrofurantoin , macrocrystal-monohydrate, (MACROBID ) 100 MG capsule Take 1 tablet by mouth as needed (after sexual intercourse; increase to twice a day x3 days with symptoms). 30 capsule 0  . rosuvastatin  (CRESTOR ) 10 MG tablet Take 1 tablet (10 mg total) by mouth daily. 90 tablet 1  . TURMERIC PO Take by mouth daily.     No current facility-administered medications on file prior to visit.    BP 120/84   Pulse 74   Temp 97.8 F (36.6 C)   Wt 111 lb 6.4 oz (50.5 kg)   SpO2 100%   BMI 19.12 kg/m chart      Objective:    BP 120/84   Pulse 74   Temp 97.8 F (36.6 C)   Wt 111 lb 6.4 oz (50.5 kg)   SpO2 100%   BMI 19.12 kg/m    Physical Exam Constitutional:      Appearance: She is well-developed and normal weight.  HENT:     Right Ear: Tympanic membrane and ear canal normal.     Left Ear: Tympanic membrane and ear canal normal.     Mouth/Throat:     Mouth: Mucous membranes are moist.     Pharynx: Oropharyngeal exudate and posterior oropharyngeal erythema present.  Cardiovascular:     Rate and Rhythm: Normal rate and regular rhythm.  Genitourinary:     Comments: Labia minora and majora erythematous, swollen, tender to touch.  Ulcerated lesion appears to be healing.  No vaginal discharge or odor Musculoskeletal:     Cervical back: Normal range of motion and neck supple.  Skin:    General: Skin is warm and dry.  Neurological:     General: No focal deficit present.     Mental Status: She is alert.    Results  for orders placed or performed in visit on 09/08/24  POCT rapid strep A  Result Value Ref Range   Rapid Strep A Screen Negative Negative        Assessment & Plan:   Problem List Items Addressed This Visit   None Visit Diagnoses       Pharyngitis, unspecified etiology    -  Primary   Relevant Orders   POCT rapid strep A (Completed)     Vaginal lesion           Meds ordered this encounter  Medications  . valACYclovir  (VALTREX ) 1000 MG tablet    Sig: Take 1 tablet (1,000 mg total) by mouth 3 (three) times daily for 7 days.    Dispense:  21 tablet    Refill:  1   Sore throat appears to have been viral and has resolved.  Call if symptoms return.  Will treat herpes outbreak.  Call the office if symptoms worsen or persist.  Recheck as scheduled and sooner as needed. No follow-ups on file.  Viliami Bracco B Kyllian Clingerman, FNP

## 2024-09-14 ENCOUNTER — Encounter: Payer: Self-pay | Admitting: Family Medicine

## 2024-09-15 ENCOUNTER — Other Ambulatory Visit: Payer: Self-pay | Admitting: Family

## 2024-09-15 MED ORDER — FLUCONAZOLE 150 MG PO TABS
150.0000 mg | ORAL_TABLET | Freq: Every day | ORAL | 0 refills | Status: AC
  Filled 2024-09-15: qty 1, 1d supply, fill #0

## 2024-09-15 NOTE — Telephone Encounter (Signed)
 Patient states vulva and labia, states these areas are itchy as well

## 2024-09-16 ENCOUNTER — Other Ambulatory Visit (HOSPITAL_BASED_OUTPATIENT_CLINIC_OR_DEPARTMENT_OTHER): Payer: Self-pay

## 2024-09-16 NOTE — Telephone Encounter (Signed)
 Called patient and schedule an acute visit with you tomorrow to discuss these new concerns

## 2024-09-16 NOTE — Telephone Encounter (Signed)
 FYI

## 2024-09-17 ENCOUNTER — Ambulatory Visit: Admitting: Family Medicine

## 2024-09-17 ENCOUNTER — Encounter: Payer: Self-pay | Admitting: Family Medicine

## 2024-09-17 ENCOUNTER — Other Ambulatory Visit (HOSPITAL_BASED_OUTPATIENT_CLINIC_OR_DEPARTMENT_OTHER): Payer: Self-pay

## 2024-09-17 VITALS — BP 110/68 | HR 83 | Temp 98.1°F | Resp 18 | Ht 64.0 in | Wt 110.8 lb

## 2024-09-17 DIAGNOSIS — L259 Unspecified contact dermatitis, unspecified cause: Secondary | ICD-10-CM

## 2024-09-17 DIAGNOSIS — B009 Herpesviral infection, unspecified: Secondary | ICD-10-CM | POA: Diagnosis not present

## 2024-09-17 MED ORDER — TRIAMCINOLONE ACETONIDE 0.1 % EX OINT
1.0000 | TOPICAL_OINTMENT | Freq: Two times a day (BID) | CUTANEOUS | 1 refills | Status: AC
  Filled 2024-09-17: qty 80, 40d supply, fill #0

## 2024-09-17 MED ORDER — PREDNISONE 10 MG PO TABS
ORAL_TABLET | ORAL | 0 refills | Status: AC
  Filled 2024-09-17 (×2): qty 18, 9d supply, fill #0

## 2024-09-17 NOTE — Progress Notes (Signed)
   Subjective:    Patient ID: Amanda Evans, female    DOB: 1961-02-11, 63 y.o.   MRN: [id number]  HPI Rash- did a lot of yard work this past weekend.  Developed a rash on R side of neck, L axilla, bilateral inner thighs.  Herpes outbreak- pt reports she has been internalizing 'a lot of stress'.  This triggered a herpes outbreak.  She developed swelling of her labia to about '3x the size'.  Took 7 days of Valtrex .   Review of Systems For ROS see HPI     Objective:   Physical Exam Vitals reviewed.  Constitutional:      General: She is not in acute distress.    Appearance: Normal appearance. She is not ill-appearing.  HENT:     Head: Normocephalic and atraumatic.  Skin:    General: Skin is warm and dry.     Findings: Rash (large, blotchy, maculopapular rash on R side of neck, L axilla, and inner thighs bilaterally) present.  Neurological:     General: No focal deficit present.     Mental Status: She is alert and oriented to person, place, and time.  Psychiatric:        Mood and Affect: Mood normal.        Behavior: Behavior normal.        Thought Content: Thought content normal.           Assessment & Plan:  Contact dermatitis- new.  Pt was doing yard work over the weekend and now w/ extremely itchy rash on neck, armpit, and thighs.  Start oral prednisone  taper and topical triamcinolone  ointment.  Oral antihistamine prn.  Pt expressed understanding and is in agreement w/ plan.   Herpes- new.  Pt reports sxs and swelling resolved w/ Valtrex .  Strongly encouraged her to start the Sertraline  she already has at home due to stress levels.  Pt expressed understanding and is in agreement w/ plan.

## 2024-09-17 NOTE — Patient Instructions (Signed)
 Follow up as needed or as scheduled START the Prednisone  as directed for the rash/itching- 3 pills at the same time x3 days, then 2 pills at the same time x3 days, then 1 pill daily.  Take w/ food  USE the Triamcinolone  ointment on the itchy/irritated areas START the Sertraline  (Zoloft ) that you have at home Call with any questions or concerns Stay Safe!  Stay Healthy! HAPPY HOLIDAYS!!!

## 2024-10-26 ENCOUNTER — Other Ambulatory Visit (HOSPITAL_COMMUNITY): Payer: Self-pay

## 2024-10-26 ENCOUNTER — Other Ambulatory Visit (HOSPITAL_BASED_OUTPATIENT_CLINIC_OR_DEPARTMENT_OTHER): Payer: Self-pay

## 2024-10-26 ENCOUNTER — Other Ambulatory Visit (HOSPITAL_BASED_OUTPATIENT_CLINIC_OR_DEPARTMENT_OTHER): Payer: Self-pay | Admitting: Obstetrics & Gynecology

## 2024-10-26 DIAGNOSIS — N952 Postmenopausal atrophic vaginitis: Secondary | ICD-10-CM

## 2024-10-26 MED ORDER — ESTRADIOL 0.01 % VA CREA
1.0000 | TOPICAL_CREAM | Freq: Every day | VAGINAL | 3 refills | Status: AC
  Filled 2024-10-26: qty 42.5, 30d supply, fill #0

## 2024-10-27 ENCOUNTER — Other Ambulatory Visit (HOSPITAL_COMMUNITY): Payer: Self-pay

## 2025-07-05 ENCOUNTER — Encounter: Admitting: Family Medicine
